# Patient Record
Sex: Male | Born: 1945 | Race: White | Hispanic: No | Marital: Married | Smoking: Current some day smoker
Health system: Southern US, Community
[De-identification: ages and names within clinical notes are randomized; demographics above are authoritative.]

## PROBLEM LIST (undated history)

## (undated) DIAGNOSIS — G35 Multiple sclerosis: Secondary | ICD-10-CM

## (undated) DIAGNOSIS — E119 Type 2 diabetes mellitus without complications: Secondary | ICD-10-CM

## (undated) DIAGNOSIS — I639 Cerebral infarction, unspecified: Secondary | ICD-10-CM

## (undated) DIAGNOSIS — E785 Hyperlipidemia, unspecified: Secondary | ICD-10-CM

## (undated) HISTORY — DX: Multiple sclerosis: G35

## (undated) HISTORY — DX: Type 2 diabetes mellitus without complications: E11.9

## (undated) HISTORY — DX: Cerebral infarction, unspecified: I63.9

## (undated) HISTORY — DX: Hyperlipidemia, unspecified: E78.5

## (undated) HISTORY — PX: HAND TENDON SURGERY: SHX663

## (undated) HISTORY — PX: OTHER SURGICAL HISTORY: SHX169

---

## 2005-04-23 DIAGNOSIS — I639 Cerebral infarction, unspecified: Secondary | ICD-10-CM

## 2005-04-23 HISTORY — DX: Cerebral infarction, unspecified: I63.9

## 2005-08-23 ENCOUNTER — Encounter: Payer: Self-pay | Admitting: Interventional Radiology

## 2005-10-05 ENCOUNTER — Ambulatory Visit (HOSPITAL_COMMUNITY): Admission: RE | Admit: 2005-10-05 | Discharge: 2005-10-06 | Payer: Self-pay | Admitting: Interventional Radiology

## 2005-10-05 ENCOUNTER — Ambulatory Visit: Admission: RE | Admit: 2005-10-05 | Discharge: 2005-10-05 | Payer: Self-pay | Admitting: Interventional Radiology

## 2005-10-26 ENCOUNTER — Encounter: Payer: Self-pay | Admitting: Interventional Radiology

## 2006-02-08 ENCOUNTER — Ambulatory Visit (HOSPITAL_COMMUNITY): Admission: RE | Admit: 2006-02-08 | Discharge: 2006-02-08 | Payer: Self-pay | Admitting: Interventional Radiology

## 2006-06-29 ENCOUNTER — Ambulatory Visit (HOSPITAL_COMMUNITY): Admission: RE | Admit: 2006-06-29 | Discharge: 2006-06-29 | Payer: Self-pay | Admitting: Interventional Radiology

## 2006-08-09 ENCOUNTER — Ambulatory Visit (HOSPITAL_COMMUNITY): Admission: RE | Admit: 2006-08-09 | Discharge: 2006-08-09 | Payer: Self-pay | Admitting: Interventional Radiology

## 2008-04-30 ENCOUNTER — Ambulatory Visit (HOSPITAL_COMMUNITY): Admission: RE | Admit: 2008-04-30 | Discharge: 2008-04-30 | Payer: Self-pay | Admitting: Interventional Radiology

## 2010-08-07 LAB — CBC
MCHC: 34.3 g/dL (ref 30.0–36.0)
MCV: 90.3 fL (ref 78.0–100.0)
Platelets: 220 10*3/uL (ref 150–400)
RDW: 13.4 % (ref 11.5–15.5)

## 2010-08-07 LAB — BASIC METABOLIC PANEL
BUN: 12 mg/dL (ref 6–23)
CO2: 27 mEq/L (ref 19–32)
Calcium: 9.1 mg/dL (ref 8.4–10.5)
Chloride: 109 mEq/L (ref 96–112)
Creatinine, Ser: 1.1 mg/dL (ref 0.4–1.5)

## 2010-08-07 LAB — PROTIME-INR
INR: 1 (ref 0.00–1.49)
Prothrombin Time: 13 seconds (ref 11.6–15.2)

## 2010-09-08 NOTE — Discharge Summary (Signed)
NAME:  Duane Harper, Duane Harper               ACCOUNT NO.:  [id number]   MEDICAL RECORD NO.:  [id number]          PATIENT TYPE:  OIB   LOCATION:  3110                         FACILITY:  [facility]   PHYSICIAN:  Sanjeev K. 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  He was admitted to [facility] at that time by Dr.  Rebecca Eaton.  An MRI/MRA was consistent with a significant left middle cerebral  artery stenosis.   The patient was referred to Dr. Corliss Skains who saw the patient in  consultation on Aug 23, 2005.  Arrangements were made to have the patient  return to [facility] on October 05, 2005 to have a cerebral angiogram  and possible PTA stenting of the lower left middle cerebral artery.   PAST MEDICAL HISTORY:  Significant for BPH, hypertension, hyperlipidemia, a  long history of tobacco use with probable COPD.  The patient has quit  smoking.  He had a left CVA in April 2007.  He had a 2-D echo in the  hospital on April 29 that showed an ejection fraction of 55%.   ALLERGIES:  NO KNOWN DRUG ALLERGIES.   MEDICATIONS AT THE TIME OF ADMISSION:  TriCor, Flomax, Avodart, Plavix,  aspirin, lisinopril and simvastatin.   SOCIAL HISTORY:  The patient is married.  He has one child.  The patient  lives in [place] with his wife.  He quit smoking in early May.  He  previously worked as a Special educational needs teacher.   FAMILY HISTORY:  The patient's mother is alive.  She has a history of a CVA  as well as coronary artery disease and hypertension.  His father died at age  4 from cardiovascular disease.  She has a sister and a brother who are  alive and well.   HOSPITAL COURSE:  As noted this patient was admitted to [facility]  on October 05, 2005 by Dr. Corliss Skains to undergo a cerebral angiogram  due to a  history of a left CVA and an abnormal MRI/MRA consistent with a significant  left middle cerebral artery stenosis.  The angiogram was performed on the  day of admission.  This angiogram did show a severe stenosis of the left  middle cerebral artery.  The patient underwent PTA stenting of the lesion  under general anesthesia performed by Dr. Corliss Skains.  The patient tolerated  this procedure well and was subsequently admitted to the neuro intensive  care unit where he remained on IV heparin overnight.  The following day the  heparin was discontinued and the right femoral groin sheath was removed.  Hemostasis was obtained.  The patient was monitored on bedrest for the next  six hours.  Afterwards she was ambulated and arrangements were made to  discharge the patient later that evening in stable and improved condition.   LABORATORY DATA:  A CBC on the day of  discharge revealed hemoglobin 11,  hematocrit 31.7, WBCs were 8500, platelets 195,000.  On admission hemoglobin  had been 13.5, hematocrit 39.5.  A chemistry profile on the day of discharge  revealed a BUN of 12, creatinine 1.3, potassium was 3.6, glucose was 103.  On admission BUN had been 12, creatinine 1.4, potassium 3.6, glucose 117.   DISCHARGE INSTRUCTIONS:  The patient was told to resume his previous  medications which did include aspirin and Plavix as well as the other  medications listed above.  He was to limit his activity for at least two  weeks which included no driving, and he was not to return to work for at  least two weeks.  He was not to lift more than 10 or 20 pounds during this  time frame as well.  The patient was given instructions regarding the wound  care for his right femoral groin wound, and he was given a follow-up  appointment to see Dr. Corliss Skains in approximately two weeks.  He was told to  follow up with his primary care physician as needed or as scheduled.   PROBLEM LIST AT TIME OF DISCHARGE:  1.   History of a left cerebrovascular accident in April 2007 with right-      sided weakness which has since resolved.  2.  Significant left middle cerebral artery stenosis, status post      percutaneous transluminal angioplasty and stenting using a Wingspan      stent.  3.  History of hypertension.  4.  History of hyperlipidemia.  5.  Previous tobacco history with probable chronic obstructive pulmonary      disease.  6.  Benign prostatic hypertrophy.  7.  A 2-D echocardiogram on August 19, 2005 revealing ejection fraction of      55%.  8.  History of facial surgery as well as surgery for contracture release of      his fingers.  9.  Mild anemia following his intervention.  10. Mildly elevated glucose levels.      Delton See, P.A.    ______________________________  Grandville Silos. Corliss Skains, M.D.    DR/MEDQ  D:  10/25/2005  T:  10/25/2005  Job:  [id number]   cc:   Tomasa Blase, Dr.  Lincoln Maxin Sopala  Fax: [phone]

## 2010-09-08 NOTE — H&P (Signed)
NAME:  Duane Harper, Duane Harper               ACCOUNT NO.:  [id number]   MEDICAL RECORD NO.:  [id number]          PATIENT TYPE:  AMB   LOCATION:  SDS                          FACILITY:  [facility]   PHYSICIAN:  Sanjeev K. Deveshwar, M.D.DATE OF BIRTH:  1945-08-14   DATE OF ADMISSION:  10/05/2005  DATE OF DISCHARGE:                                HISTORY & PHYSICAL   CHIEF COMPLAINT:  History of left middle cerebral artery stenosis.   HISTORY OF PRESENT ILLNESS:  This is a very pleasant 65 year old male who  had a left CVA in April of 2007. He initially presented with right hand  numbness as well as a headache and some expressive aphasia. The patient was  admitted to [facility] by Dr. Maisie Fus. A MRI/MRA was consistent  with a significant left middle cerebral artery stenosis. The patient was  subsequently referred to Dr. Corliss Skains who saw the patient on Aug 23, 2005.  Arrangements were made to have the patient to return today for cerebral  angiogram and possible PTA stenting of the left middle cerebral artery.   PAST MEDICAL HISTORY:  1.  Significant for BPH.  2.  Hypertension.  3.  Hyperlipidemia.  4.  A long history of tobacco use. He has since quit. He probably has some      COPD.  5.  As noted, he had a left CVA in April of 2007. He had an echocardiogram      while in the hospital on April 29 that showed an ejection fraction of      55%.   ALLERGIES:  No known drug allergies.   CURRENT MEDICATIONS:  TriCor, Flomax, Avodart, Plavix, aspirin, lisinopril  and simvastatin.   SOCIAL HISTORY:  The patient is married. He has one child who lives in  [place] with his wife. He quit smoking in early May. He was working as a  Special educational needs teacher.   FAMILY HISTORY:  His mother is alive. She has a history of a CVA, coronary  artery disease and hypertension. His father passed away at age 74 from  cardiovascular disease. He has a sister and a brother who are alive and  well.   REVIEW OF  SYSTEMS:  Completely negative except for frequent headaches ever  since his CVA. As noted, he does have BPH and some urinary problems due to  the BPH. He notes that he bruises easily since being on aspirin and Plavix.  He has noted any residual deficits from his CVA.   PAST SURGICAL HISTORY:  Significant for some facial surgery and a  contracture release on his fingers. He denies any problems with anesthesia.   LABORATORY DATA:  An INR is 1.0, PTT is 32. CBC reveals hemoglobin 13,  hematocrit 37.7, platelets 230,000, WBC 7.7 thousand. BUN is 13, creatinine  1.3, potassium 3.8, sodium 138, glucose 106.   PHYSICAL EXAMINATION:  GENERAL:  Reveals a very pleasant, 65 year old white  male in no acute distress.  VITAL SIGNS:  Blood pressure 175/80, pulse 62, respirations 16, temperature  98, oxygen saturation 99% on room air.  HEENT:  Unremarkable.  NECK:  Reveals no bruits. No jugular venous distention.  HEART:  Reveals regular rate and rhythm without murmur.  LUNGS:  Clear but decreased.  ABDOMEN:  Soft, nontender.  EXTREMITIES:  Reveal pulses to be intact without edema.  NEUROLOGICAL:  Mental status:  The patient is alert and oriented and follows  commands. Cranial nerves II-XII are grossly intact. Sensation is intact to  light touch. Motor strength is 5/5 throughout. Cerebellar testing is intact.   His airway is rated at a 3. His ASA scale is a 3.   IMPRESSION:  1.  Left middle cerebral artery cerebral vascular accident with initial      right sided weakness which has since resolved with initial presentation      in April 2007.  2.  History of left middle cerebral artery stenosis by MRI/MRA.  3.  History of hypertension.  4.  History of hyperlipidemia.  5.  History of tobacco use with probable chronic obstructive pulmonary      disease, recently quit.  6.  Benign prostatic hypertrophy.  7.  Ejection fraction 55% by echocardiogram performed August 19, 2005.  8.  History of facial  surgery as well as surgery for contracture release of      his fingers.   PLAN:  The patient will undergo a cerebral angiogram today with possible PTA  stenting of the left middle cerebral artery if felt to be indicated and  safe.      Delton See, P.A.    ______________________________  Grandville Silos. Corliss Skains, M.D.    DR/MEDQ  D:  10/05/2005  T:  10/05/2005  Job:  [id number]   cc:   Maisie Fus  Fax: [phone]   Tomasa Blase, M.D.  [place]

## 2010-09-08 NOTE — Consult Note (Signed)
NAME:  Duane Harper, Duane Harper NO.:  [id number]   MEDICAL RECORD NO.:  [id number]          PATIENT TYPE:  OUT   LOCATION:  XRAY                         FACILITY:  [facility]   PHYSICIAN:  Delton See, P.A.   DATE OF BIRTH:  01-Aug-1945   DATE OF CONSULTATION:  10/26/2005  DATE OF DISCHARGE:                                   CONSULTATION   BRIEF HISTORY:  This is a pleasant 65 year old male who suffered a left CVA  in April 2007.  He initially presented with right hand numbness as well as a  headache and some expressive aphasia.  He was admitted to [facility]  by Dr. Rebecca Eaton.  An MRI/MRA was consistent with a significant left middle  cerebral artery stenosis.  The patient was referred to Dr. Corliss Skains, who  saw the patient in consultation Aug 23, 2005.  The patient was brought back  to [facility] on October 05, 2005, for a cerebral angiogram and PTA  stenting of the left middle cerebral artery using a Wingspan stent.  The  patient tolerated the procedure well.  He returns today accompanied by his  wife to be seen in follow-up.   PAST MEDICAL HISTORY:  1.  Significant for the above-noted CVA.  2.  Hypertension.  3.  Hyperlipidemia.  4.  BPH.  5.  A long history of tobacco use.  6.  Questionable COPD.  He quit smoking at the time of his CVA.  7.  He had a 2-D echo performed on April 29 that showed ejection fraction of      55%.   ALLERGIES:  No known drug allergies.   MEDICATIONS:  Tricor, Flomax, Avodart, Plavix, aspirin, lisinopril and  simvastatin.   SOCIAL HISTORY:  The patient is married.  He has one child.  The patient  lives in [place] with his wife.  He quit smoking in early May.  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   FAMILY HISTORY:  His mother is alive.  She has a history of a CVA, coronary  artery disease and hypertension.  His father passed away at age 24 from  cardiovascular disease.  She has a sister and  a brother, who are alive and  well.   IMPRESSION AND PLAN:  As noted, the patient returns today to be seen by Dr.  Corliss Skains approximately 2 weeks following his left middle cerebral artery  stent placement.  The patient is accompanied by his wife.  They report he is  doing well.  He remains on aspirin and Plavix.  He was concerned about the  fact that he bleeds very easily and freely whenever he injures himself.  He  attributes this, of course, to the aspirin and Plavix.  He asked Dr.  Corliss Skains if he might be able to come off the Plavix or at least cut down on  the dose, possibly to every other day.  Dr. Corliss Skains felt that the unless  the patient was having serious bleeding complications, he should remain on  the current dose of aspirin  and Plavix.  He is taking aspirin 81 mg daily.   The patient notes that he has headaches almost daily.  They are not severe,  but this is new for him.  He has also had occasional word-finding  difficulties, which she has had since his stroke.  He reports no significant  weakness or numbness of the right side.   The patient has resumed driving and most of his normal activities.  Dr.  Corliss Skains advised him not to do anything too strenuous and not to lift more  than 25 pounds.  Other than that, he felt that he could do pretty much as he  felt.   Dr. Corliss Skains recommend a repeat angiogram in 3-4 months in order further  evaluate the previously-stented left middle cerebral artery and to further  evaluate his other cerebrovascular circulation.  A possible CT scan had been  discussed for today.  However, the patient is doing so well, Dr. Corliss Skains  did not feel that it was necessary.   Greater than 30 minutes was spent on this consult.      Delton See, P.A.     DR/MEDQ  D:  10/26/2005  T:  10/26/2005  Job:  [id number]   cc:   Maisie Fus  Fax: [phone]   Foye Deer, M.D.  [place], [place]

## 2014-06-01 ENCOUNTER — Ambulatory Visit (INDEPENDENT_AMBULATORY_CARE_PROVIDER_SITE_OTHER): Payer: PPO | Admitting: Diagnostic Neuroimaging

## 2014-06-01 ENCOUNTER — Encounter: Payer: Self-pay | Admitting: Diagnostic Neuroimaging

## 2014-06-01 VITALS — BP 165/79 | HR 55 | Ht 70.0 in | Wt 194.0 lb

## 2014-06-01 DIAGNOSIS — R930 Abnormal findings on diagnostic imaging of skull and head, not elsewhere classified: Secondary | ICD-10-CM

## 2014-06-01 DIAGNOSIS — R9089 Other abnormal findings on diagnostic imaging of central nervous system: Secondary | ICD-10-CM

## 2014-06-01 NOTE — Patient Instructions (Signed)
I will check labs, MRI and evoked potential testing.

## 2014-06-01 NOTE — Progress Notes (Signed)
[facility]  PATIENT: Duane Harper DOB: 04-18-1946  REFERRING CLINICIAN: Sherwood Gambler / Ronnald Ramp HISTORY FROM: patient  REASON FOR VISIT: new consult    HISTORICAL  CHIEF COMPLAINT:  Chief Complaint  Patient presents with  . New Evaluation    having trouble walking, legs are shaking    HISTORY OF PRESENT ILLNESS:   69 year old right-handed male here for evaluation of abnormal MRI and brain stem lesion.  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 Patient has hypertension, diabetes, cigarette smoking.  In February 2015 patient had onset of tingling and numbness in bilateral knees, with progressive weakness into bilateral thighs. Patient was evaluated with MRI lumbar spine which showed degenerative changes. He was then referred to neurosurgery noted some hyperreflexia. Patient was lowered with MRI cervical and thoracic spine. Patient was found to have incidental brainstem lesion on MRI cervical spine. MRI brain again demonstrated this lesion with severe chronic small vessel ischemic disease. Question of possible superimposed demyelinating disease was raised.  Since that time patient's numbness and weakness in legs has continued to progress.  Of note patient also has some language and word finding difficulties since 2007.    REVIEW OF SYSTEMS: Full 14 system review of systems performed and notable only for restless legs weakness dizziness.  ALLERGIES: No Known Allergies  HOME MEDICATIONS: No outpatient prescriptions prior to visit.   No facility-administered medications prior to visit.   Prior to Admission medications   Medication Sig Start Date End Date Taking? Authorizing Provider  amLODipine (NORVASC) 5 MG tablet Take 5 mg by mouth daily.   Yes Historical Provider, MD  clopidogrel (PLAVIX) 75 MG tablet Take 75 mg by mouth daily.   Yes Historical Provider, MD  glimepiride (AMARYL) 4 MG tablet Take  4 mg by mouth daily with breakfast.   Yes Historical Provider, MD  lisinopril (PRINIVIL,ZESTRIL) 40 MG tablet Take 40 mg by mouth daily.   Yes Historical Provider, MD  meloxicam (MOBIC) 15 MG tablet Take 15 mg by mouth daily.   Yes Historical Provider, MD  metoprolol succinate (TOPROL-XL) 50 MG 24 hr tablet Take 50 mg by mouth daily. Take with or immediately following a meal.   Yes Historical Provider, MD     PAST MEDICAL HISTORY: Past Medical History  Diagnosis Date  . Stroke 2007  . Diabetes     PAST SURGICAL HISTORY: Past Surgical History  Procedure Laterality Date  . Brain stent      FAMILY HISTORY: Family History  Problem Relation Age of Onset  . Stroke    . Heart attack      SOCIAL HISTORY:  History   Social History  . Marital Status: Married    Spouse Name: Vaughan Basta     Number of Children: 1  . Years of Education: college   Occupational History  . retired     Social History Main Topics  . Smoking status: Current Some Day Smoker  . Smokeless tobacco: Not on file     Comment: 1-2 cigarettes a day  . Alcohol Use: 4.2 - 8.4 oz/week    7-14 Not specified per week  . Drug Use: No  . Sexual Activity: Not on file   Other Topics Concern  . Not on file   Social History Narrative   Live with wife Vaughan Basta   Drinks 3 cups of coffee a day        PHYSICAL EXAM  Filed Vitals:  06/01/14 1130 06/01/14 1209  BP: 190/85 165/79  Pulse: 58 55  Height: 5' 10"  (1.778 m)   Weight: 194 lb (87.998 kg)     Body mass index is 27.84 kg/(m^2).  No exam data present  No flowsheet data found.  GENERAL EXAM: Patient is in no distress; well developed, nourished and groomed; neck is supple  CARDIOVASCULAR: Regular rate and rhythm, no murmurs, no carotid bruits  NEUROLOGIC: MENTAL STATUS: awake, alert, oriented to person, place and time, recent and remote memory intact, normal attention and concentration, SOME MILD DECR  FLUENCY AND COMPREHENSION, naming intact, fund  of knowledge appropriate CRANIAL NERVE: no papilledema on fundoscopic exam, pupils equal and reactive to light, visual fields full to confrontation, extraocular muscles intact, no nystagmus, facial sensation and strength symmetric, hearing intact, palate elevates symmetrically, uvula midline, shoulder shrug symmetric, tongue midline. MOTOR: normal bulk and tone, full strength in the BUE, BLE; EXCEPT RIGHT HF 4+ SENSORY: normal and symmetric to light touch, pinprick, temperature, vibration COORDINATION: finger-nose-finger, fine finger normal REFLEXES: deep tendon reflexes present and symmetric (BUE 2, KNEES 2, ANKLES 1; TOES MUTE) GAIT/STATION: narrow based gait; SLOW, SLIGHTLY ATAXIC, able to walk on toes and tandem; MILD DIFF WITH HEEL GAIT; romberg is negative    DIAGNOSTIC DATA (LABS, IMAGING, TESTING) - I reviewed patient records, labs, notes, testing and imaging myself where available.  Lab Results  Component Value Date   WBC 6.1 04/30/2008   HGB 14.1 04/30/2008   HCT 41.1 04/30/2008   MCV 90.3 04/30/2008   PLT 220 04/30/2008      Component Value Date/Time   NA 141 04/30/2008 0817   K 4.0 04/30/2008 0817   CL 109 04/30/2008 0817   CO2 27 04/30/2008 0817   GLUCOSE 122* 04/30/2008 0817   BUN 12 04/30/2008 0817   CREATININE 1.10 04/30/2008 0817   CALCIUM 9.1 04/30/2008 0817   GFRNONAA >60 04/30/2008 0817   GFRAA  04/30/2008 0817    >60        The eGFR has been calculated using the MDRD equation. This calculation has not been validated in all clinical situations. eGFR's persistently <60 mL/min signify possible Chronic Kidney Disease.   No results found for: CHOL, HDL, LDLCALC, LDLDIRECT, TRIG, CHOLHDL No results found for: HGBA1C No results found for: VITAMINB12 No results found for: TSH  I reviewed images myself and agree with interpretation. -VRP  04/21/14 MRI brain 1. Stable appearance of an ovoid T2 hyperintense lesion within the posterior aspect of the  cervicomedullary junction without enhancement. This most likely represents a demyelinating lesion. A post ischemic lesion is also considered. This does not have the appearance of a neoplasm. 2. Remote lacunar infarcts of the basal ganglia bilaterally. 3. Extensive white matter changes in diffuse atrophy. This likely represents post ischemic changes with evidence for a peripheral infarct in the left cerebellum. The differential diagnosis includes and advanced demyelinating process. 4. No acute intracranial abnormality.  02/24/14 MRI cervical spine - 10-11 mm lesion at the cervicomedullary junction in the region of the obex. The most likely diagnosis is demyelinating disease. However, the possibility of mass or infarction is not excluded. Evaluation with contrast is suggested, as described above.   02/24/14 MRI thoracic - No cord lesion in the thoracic spine. Minimal, non-compressive disc bulges.      ASSESSMENT AND PLAN  69 y.o. year old male here with severe cerebrovascular disease with uncontrolled hypertension and diabetes, prior left MCA stent and left brain TIA, now with new  brainstem/cervical medullary junction lesion. Numerous present ischemic, demyelinating, autoimmune, inflammatory etiology. Neoplasm less likely.   PLAN:  - further workup for multiple sclerosis vs stroke vs other autoimmune/inflamm  Orders Placed This Encounter  Procedures  . MR Brain W Wo Contrast  . Vitamin B12  . TSH  . Angiotensin converting enzyme  . HIV antibody (with reflex)  . ANA w/Reflex if Positive  . ANCA screen with reflex titer  . Copper, Serum  . Ceruloplasmin  . Visual evoked potential test   Return in about 1 month (around 06/30/2014) for after MRI brain.    Penni Bombard, MD 12/30/2427, 98:06 PM Certified in Neurology, Neurophysiology and Neuroimaging  [facility] [address] [place], [place] [address] [phone]

## 2014-06-02 ENCOUNTER — Ambulatory Visit (INDEPENDENT_AMBULATORY_CARE_PROVIDER_SITE_OTHER): Payer: PPO | Admitting: Diagnostic Neuroimaging

## 2014-06-02 DIAGNOSIS — R930 Abnormal findings on diagnostic imaging of skull and head, not elsewhere classified: Secondary | ICD-10-CM

## 2014-06-02 DIAGNOSIS — R9089 Other abnormal findings on diagnostic imaging of central nervous system: Secondary | ICD-10-CM

## 2014-06-02 LAB — PAN-ANCA
Atypical pANCA: <1:20 {titer}
C-ANCA: <1:20 {titer}
Myeloperoxidase Ab: <9 U/mL (ref 0.0–9.0)
P-ANCA: <1:20 {titer}

## 2014-06-02 LAB — SPECIMEN STATUS REPORT

## 2014-06-05 LAB — CERULOPLASMIN: Ceruloplasmin: 25.7 mg/dL (ref 16.0–31.0)

## 2014-06-05 LAB — VITAMIN B12: Vitamin B-12: 216 pg/mL (ref 211–946)

## 2014-06-05 LAB — HIV ANTIBODY (ROUTINE TESTING W REFLEX): HIV Screen 4th Generation wRfx: NONREACTIVE

## 2014-06-05 LAB — ANA W/REFLEX IF POSITIVE: Anti Nuclear Antibody(ANA): NEGATIVE

## 2014-06-05 LAB — COPPER, SERUM: COPPER: 123 ug/dL (ref 72–166)

## 2014-06-05 LAB — TSH: TSH: 0.906 u[IU]/mL (ref 0.450–4.500)

## 2014-06-05 LAB — ANGIOTENSIN CONVERTING ENZYME: Angio Convert Enzyme: <14 U/L — ABNORMAL LOW (ref 14–82)

## 2014-06-09 ENCOUNTER — Telehealth: Payer: Self-pay | Admitting: *Deleted

## 2014-06-09 NOTE — Telephone Encounter (Signed)
Entered in error

## 2014-06-16 NOTE — Procedures (Signed)
    [facility]  VEP (VISUAL EVOKED POTENTIAL) REPORT   STUDY DATE: 06/02/14 PATIENT NAME: Duane Harper DOB: [birth year] MRN: [id number]  ORDERING CLINICIAN: Joycelyn Schmid, MD   TECHNOLOGIST: Gearldine Shown  TECHNIQUE: The visual evoked potential test was performed using 32 x 32 check sizes with full pattern reversal. CLINICAL INFORMATION: 69 year old male with abnormal MRI brain. Evaluate for demyelinating disease.  FINDINGS: The visual acuity was 20/100 OD and 20/30 OS.  There are well formed evoked potential wave forms bilaterally.   P100 latency with right eye stimulation: 127 ms.   P100 latency with left eye stimulation: 112 ms.  The amplitudes for the P100 waveforms were also within normal limits bilaterally.   IMPRESSION:  This visual evoked potential study is abnormal. There is prolongation of P100 latency with right eye stimulation, which raises possibility of dysfunction of the optic pathways anterior to the chiasm on the right side. This can be seen with compressive or demyelinating etiologies.      INTERPRETING PHYSICIAN:  Suanne Marker, MD Certified in Neurology, Neurophysiology and Neuroimaging  [facility] [address] [place], [place] [address] [phone]

## 2014-06-21 ENCOUNTER — Ambulatory Visit
Admission: RE | Admit: 2014-06-21 | Discharge: 2014-06-21 | Disposition: A | Discharge status: Home / Self Care | Payer: PPO | Source: Ambulatory Visit | Attending: Diagnostic Neuroimaging | Admitting: Diagnostic Neuroimaging

## 2014-06-21 DIAGNOSIS — R269 Unspecified abnormalities of gait and mobility: Secondary | ICD-10-CM | POA: Diagnosis not present

## 2014-06-21 DIAGNOSIS — R9089 Other abnormal findings on diagnostic imaging of central nervous system: Secondary | ICD-10-CM

## 2014-06-21 DIAGNOSIS — R930 Abnormal findings on diagnostic imaging of skull and head, not elsewhere classified: Secondary | ICD-10-CM | POA: Diagnosis not present

## 2014-06-21 MED ORDER — GADOBENATE DIMEGLUMINE 529 MG/ML IV SOLN
18.0000 mL | Freq: Once | INTRAVENOUS | Status: AC | PRN
  Administered 2014-06-21: 18 mL via INTRAVENOUS

## 2014-06-29 ENCOUNTER — Telehealth: Payer: Self-pay | Admitting: Diagnostic Neuroimaging

## 2014-06-29 DIAGNOSIS — R9089 Other abnormal findings on diagnostic imaging of central nervous system: Secondary | ICD-10-CM

## 2014-06-29 DIAGNOSIS — R270 Ataxia, unspecified: Secondary | ICD-10-CM

## 2014-06-29 NOTE — Telephone Encounter (Signed)
Patient is calling to get results from his MRI test. Please call.  Thanks!

## 2014-07-02 NOTE — Telephone Encounter (Signed)
I called patient. Gave results. Progression of MRI brain and abnl visual evoked potential. Will setup LP for multiple sclerosis evaluation.  Suanne Marker, MD 07/02/2014, 2:43 PM Certified in Neurology, Neurophysiology and Neuroimaging  [facility] [address] [place], [place] [address] [phone]

## 2014-07-02 NOTE — Telephone Encounter (Signed)
Pt is calling back requesting his results of his MRI.  Please call and advise.

## 2014-07-14 ENCOUNTER — Other Ambulatory Visit (HOSPITAL_COMMUNITY)
Admission: RE | Admit: 2014-07-14 | Discharge: 2014-07-14 | Disposition: A | Discharge status: Home / Self Care | Payer: PPO | Source: Ambulatory Visit | Attending: Diagnostic Neuroimaging | Admitting: Diagnostic Neuroimaging

## 2014-07-14 ENCOUNTER — Ambulatory Visit
Admission: RE | Admit: 2014-07-14 | Discharge: 2014-07-14 | Disposition: A | Discharge status: Home / Self Care | Payer: PPO | Source: Ambulatory Visit | Attending: Diagnostic Neuroimaging | Admitting: Diagnostic Neuroimaging

## 2014-07-14 ENCOUNTER — Other Ambulatory Visit: Payer: Self-pay | Admitting: Diagnostic Neuroimaging

## 2014-07-14 DIAGNOSIS — G960 Cerebrospinal fluid leak: Secondary | ICD-10-CM | POA: Diagnosis present

## 2014-07-14 DIAGNOSIS — R270 Ataxia, unspecified: Secondary | ICD-10-CM

## 2014-07-14 DIAGNOSIS — R930 Abnormal findings on diagnostic imaging of skull and head, not elsewhere classified: Secondary | ICD-10-CM | POA: Insufficient documentation

## 2014-07-14 DIAGNOSIS — R9089 Other abnormal findings on diagnostic imaging of central nervous system: Secondary | ICD-10-CM

## 2014-07-14 LAB — CSF CELL COUNT WITH DIFFERENTIAL
RBC Count, CSF: 0 cu mm
Tube #: 3
WBC CSF: 0 uL (ref 0–5)

## 2014-07-14 LAB — GLUCOSE, CSF: Glucose, CSF: 72 mg/dL (ref 43–76)

## 2014-07-14 LAB — PROTEIN, CSF: TOTAL PROTEIN, CSF: 67 mg/dL — AB (ref 15–45)

## 2014-07-14 NOTE — Progress Notes (Signed)
Two tiger-topped tubes of blood drawn for LP labs from right [facility] space; site unremarkable.  jkl

## 2014-07-14 NOTE — Discharge Instructions (Signed)
Lumbar Puncture Discharge Instructions  1. Go home and rest quietly for the next 24 hours.  It is important to lie flat for the next 24 hours.  Get up only to go to the restroom.  You may lie in the bed or on a couch on your back, your stomach, your left side or your right side.  You may have one pillow under your head.  You may have pillows between your knees while you are on your side or under your knees while you are on your back.  2. DO NOT drive today.  Recline the seat as far back as it will go, while still wearing your seat belt, on the way home.  3. You may get up to go to the bathroom as needed.  You may sit up for 10 minutes to eat.  You may resume your normal diet and medications unless otherwise indicated.  Drink plenty of extra fluids today and tomorrow.  4. The incidence of a spinal headache with nausea and/or vomiting is about 5% (one in 20 patients).  If you develop a headache, lie flat and drink plenty of fluids until the headache goes away.  Caffeinated beverages may be helpful.  If you develop severe nausea and vomiting or a headache that does not go away with flat bed rest, please call the physician who sent you here.  5. You may resume normal activities after your 24 hours of bed rest is over; however, do not exert yourself strongly or do any heavy lifting tomorrow.  Please call us at [phone] with any questions or concerns.  6. Call your physician for a follow-up appointment.  You may resume Plavix today. 7.

## 2014-07-17 LAB — CSF CULTURE W GRAM STAIN: Organism ID, Bacteria: NO GROWTH

## 2014-07-17 LAB — OLIGOCLONAL BANDS, CSF + SERM

## 2014-07-17 LAB — CSF CULTURE: GRAM STAIN: NONE SEEN

## 2014-07-23 ENCOUNTER — Telehealth: Payer: Self-pay | Admitting: Diagnostic Neuroimaging

## 2014-07-23 NOTE — Telephone Encounter (Signed)
Pt is calling wanting to get results from his Lumbar Puncture.  Please call and advise.

## 2014-07-26 ENCOUNTER — Telehealth: Payer: Self-pay | Admitting: Diagnostic Neuroimaging

## 2014-07-26 NOTE — Telephone Encounter (Signed)
Called and left a message asking the pt to call back and schedule a follow-up appt in the next week or two. When he calls back please schedule him for a 20 or 30 min follow up in the next 1-2 weeks

## 2014-07-26 NOTE — Telephone Encounter (Signed)
I called pt; spoke with wife. Pt likely has multiple sclerosis. Will setup appt in next 1-2 weeks to discuss further and discuss treatment options. -VRP

## 2014-07-28 ENCOUNTER — Telehealth: Payer: Self-pay | Admitting: Diagnostic Neuroimaging

## 2014-07-28 NOTE — Telephone Encounter (Signed)
Called back and left a message for Duane Harper informing her of the ICD code of Multiple Sclerosis which is G35. Told her to call with any further questions

## 2014-07-28 NOTE — Telephone Encounter (Signed)
Danielle with [facility] @ [phone], requesting additional diagnosis code for CSF fluid in order for insurance to pay for labs.  Please call and advise.

## 2014-08-03 ENCOUNTER — Encounter: Payer: Self-pay | Admitting: Diagnostic Neuroimaging

## 2014-08-03 ENCOUNTER — Ambulatory Visit (INDEPENDENT_AMBULATORY_CARE_PROVIDER_SITE_OTHER): Payer: PPO | Admitting: Diagnostic Neuroimaging

## 2014-08-03 VITALS — BP 194/84 | HR 62 | Ht 70.0 in | Wt 196.6 lb

## 2014-08-03 DIAGNOSIS — I672 Cerebral atherosclerosis: Secondary | ICD-10-CM

## 2014-08-03 DIAGNOSIS — R0683 Snoring: Secondary | ICD-10-CM

## 2014-08-03 DIAGNOSIS — G35 Multiple sclerosis: Secondary | ICD-10-CM | POA: Diagnosis not present

## 2014-08-03 DIAGNOSIS — I10 Essential (primary) hypertension: Secondary | ICD-10-CM

## 2014-08-03 DIAGNOSIS — R5382 Chronic fatigue, unspecified: Secondary | ICD-10-CM | POA: Diagnosis not present

## 2014-08-03 DIAGNOSIS — G35D Multiple sclerosis, unspecified: Secondary | ICD-10-CM

## 2014-08-03 NOTE — Progress Notes (Signed)
[facility]  PATIENT: Duane Harper DOB: May 26, 1945  REFERRING CLINICIAN: Sherwood Gambler / Ronnald Ramp HISTORY FROM: patient and wife REASON FOR VISIT: follow up   HISTORICAL  CHIEF COMPLAINT:  Chief Complaint  Patient presents with  . Follow-up    HISTORY OF PRESENT ILLNESS:   UPDATE 08/03/14: Since last visit, testing completed (VEP, MRIs, labs, CSF). Likely patient has multiple sclerosis in addition to prior severe chronic cerebral atherosclerosis due to HTN, DM, hyperlipidemia, smoking. No new symptsoms  PRIOR HPI (06/01/14): 69 year old right-handed male here for evaluation of abnormal MRI and brain stem lesion. 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 Patient has hypertension, diabetes, cigarette smoking. In February 2015 patient had onset of tingling and numbness in bilateral knees, with progressive weakness into bilateral thighs. Patient was evaluated with MRI lumbar spine which showed degenerative changes. He was then referred to neurosurgery noted some hyperreflexia. Patient was lowered with MRI cervical and thoracic spine. Patient was found to have incidental brainstem lesion on MRI cervical spine. MRI brain again demonstrated this lesion with severe chronic small vessel ischemic disease. Question of possible superimposed demyelinating disease was raised. Since that time patient's numbness and weakness in legs has continued to progress. Of note patient also has some language and word finding difficulties since 2007.    REVIEW OF SYSTEMS: Full 14 system review of systems performed and notable only for restless legs weakness dizziness snoring.   ALLERGIES: No Known Allergies  HOME MEDICATIONS: Outpatient Prescriptions Prior to Visit  Medication Sig Dispense Refill  . amLODipine (NORVASC) 5 MG tablet Take 5 mg by mouth daily.    . clopidogrel (PLAVIX) 75 MG tablet Take 75 mg by mouth daily.    Marland Kitchen  glimepiride (AMARYL) 4 MG tablet Take 4 mg by mouth daily with breakfast.    . lisinopril (PRINIVIL,ZESTRIL) 40 MG tablet Take 40 mg by mouth daily.    . meloxicam (MOBIC) 15 MG tablet Take 15 mg by mouth daily.    . metoprolol succinate (TOPROL-XL) 50 MG 24 hr tablet Take 50 mg by mouth daily. Take with or immediately following a meal.     No facility-administered medications prior to visit.    PAST MEDICAL HISTORY: Past Medical History  Diagnosis Date  . Stroke 2007  . Diabetes     PAST SURGICAL HISTORY: Past Surgical History  Procedure Laterality Date  . Brain stent      FAMILY HISTORY: Family History  Problem Relation Age of Onset  . Stroke    . Heart attack      SOCIAL HISTORY:  History   Social History  . Marital Status: Married    Spouse Name: Vaughan Basta   . Number of Children: 1  . Years of Education: college   Occupational History  . retired     Social History Main Topics  . Smoking status: Current Some Day Smoker  . Smokeless tobacco: Not on file     Comment: 1-2 cigarettes a day  . Alcohol Use: 4.2 - 8.4 oz/week    7-14 Standard drinks or equivalent per week  . Drug Use: No  . Sexual Activity: Not on file   Other Topics Concern  . Not on file   Social History Narrative   Live with wife Vaughan Basta   Drinks 3 cups of coffee a day (depends on the day)        PHYSICAL EXAM  Filed Vitals:   08/03/14 1353  BP: 194/84  Pulse: 62  Height: 5' 10"  (1.778 m)  Weight: 196 lb 9.6 oz (89.177 kg)    Body mass index is 28.21 kg/(m^2).  No exam data present  No flowsheet data found.  GENERAL EXAM: Patient is in no distress; well developed, nourished and groomed; neck is supple  CARDIOVASCULAR: Regular rate and rhythm, no murmurs, no carotid bruits  NEUROLOGIC: MENTAL STATUS: awake, alert, oriented to person, place and time, recent and remote memory intact, normal attention and concentration, SOME MILD DECR  FLUENCY AND COMPREHENSION, naming intact,  fund of knowledge appropriate CRANIAL NERVE: pupils equal and reactive to light, visual fields full to confrontation, extraocular muscles intact, no nystagmus, facial sensation and strength symmetric, hearing intact, palate elevates symmetrically, uvula midline, shoulder shrug symmetric, tongue midline. MOTOR: normal bulk and tone, full strength in the BUE, BLE SENSORY: normal and symmetric to light touch, pinprick, temperature, vibration COORDINATION: finger-nose-finger, fine finger normal REFLEXES: BUE 2, KNEES 2, ANKLES 1 GAIT/STATION: narrow based gait; SLOW, SLIGHTLY ATAXIC.    DIAGNOSTIC DATA (LABS, IMAGING, TESTING) - I reviewed patient records, labs, notes, testing and imaging myself where available.  Lab Results  Component Value Date   WBC 6.1 04/30/2008   HGB 14.1 04/30/2008   HCT 41.1 04/30/2008   MCV 90.3 04/30/2008   PLT 220 04/30/2008      Component Value Date/Time   NA 141 04/30/2008 0817   K 4.0 04/30/2008 0817   CL 109 04/30/2008 0817   CO2 27 04/30/2008 0817   GLUCOSE 122* 04/30/2008 0817   BUN 12 04/30/2008 0817   CREATININE 1.10 04/30/2008 0817   CALCIUM 9.1 04/30/2008 0817   GFRNONAA >60 04/30/2008 0817   GFRAA  04/30/2008 0817    >60        The eGFR has been calculated using the MDRD equation. This calculation has not been validated in all clinical situations. eGFR's persistently <60 mL/min signify possible Chronic Kidney Disease.   No results found for: CHOL, HDL, LDLCALC, LDLDIRECT, TRIG, CHOLHDL No results found for: HGBA1C Lab Results  Component Value Date   VITAMINB12 216 06/01/2014   Lab Results  Component Value Date   TSH 0.906 06/01/2014    I reviewed images myself and agree with interpretation. -VRP    02/24/14 MRI cervical spine - 10-11 mm lesion at the cervicomedullary junction in the region of the obex. The most likely diagnosis is demyelinating disease. However, the possibility of mass or infarction is not excluded.  Evaluation with contrast is suggested, as described above.   02/24/14 MRI thoracic - No cord lesion in the thoracic spine. Minimal, non-compressive disc bulges.  06/02/14 visual evoked potential - There is prolongation of P100 latency with right eye stimulation, which raises possibility of dysfunction of the optic pathways anterior to the chiasm on the right side. This can be seen with compressive or demyelinating etiologies.   04/21/14 MRI brain 1. Stable appearance of an ovoid T2 hyperintense lesion within the posterior aspect of the cervicomedullary junction without enhancement. This most likely represents a demyelinating lesion. A post ischemic lesion is also considered. This does not have the appearance of a neoplasm. 2. Remote lacunar infarcts of the basal ganglia bilaterally. 3. Extensive white matter changes in diffuse atrophy. This likely represents post ischemic changes with evidence for a peripheral infarct in the left cerebellum. The differential diagnosis includes and advanced demyelinating process. 4. No acute intracranial abnormality.  06/21/14 MRI of the brain with and without contrast showing: 1. Widespread  white matter signal changes in both hemispheres and in the medulla that could represent demyelination or ischemic changes. The extent is unusual in genetic diseases such as adrenoleukodystrophy and CADASIL could be considered. These changes are unchanged when compared to 04/21/2014 but have deftly progressed when compared to MRI dated 06/29/2006. 2. Since 04/21/2014, there has been the interval development of a focus in the left pons that likely represents a lacunar infarction. It is not apparent on diffusion-weighted images and does not appear to be acute.   07/14/14 CSF - WBC 0, RBC 0, protein 67, glucose 72, oligoclonal bands > 5, cultures negative  06/01/14 labs - ANCA, ANA, TSH, b12, HIV, copper, ceruloplasmin - negative      ASSESSMENT AND PLAN  69 y.o. 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 Visual evoked potention testing, CSF analysis, and progression of brain lesions, consistent with new diagnosis of multiple sclerosis. The numerous brain lesions are due to combination of chronic ischemic and chronic demyelinating disease.  Dx: multiple sclerosis + severe cerebral atherosclerosis / vascular disease   PLAN: - start rebif - stop smoking - sleep study (HTN, stroke, fatigue, snoring) - needs aggressive vascular risk factor reduction via PCP; especially uncontrolled BP (today 194/84); he will call PCP today  Orders Placed This Encounter  Procedures  . CBC with Differential/Platelet  . Comprehensive metabolic panel  . Vit D  25 hydroxy (rtn osteoporosis monitoring)  . Ambulatory referral to Sleep Studies   Return in about 3 months (around 11/02/2014).  I spent 25 minutes of face to face time with patient. Greater than 50% of time was spent in counseling and coordination of care with patient.    Penni Bombard, MD 3/38/2505, 3:97 PM Certified in Neurology, Neurophysiology and Neuroimaging  [facility] [address], [place], [place] [address] [phone]

## 2014-08-03 NOTE — Patient Instructions (Signed)
I will start rebif.  Please stop smoking.  I will setup sleep study.

## 2014-08-04 ENCOUNTER — Telehealth: Payer: Self-pay | Admitting: Diagnostic Neuroimaging

## 2014-08-04 LAB — COMPREHENSIVE METABOLIC PANEL
A/G RATIO: 1.6 (ref 1.1–2.5)
ALT: 22 IU/L (ref 0–44)
AST: 16 IU/L (ref 0–40)
Albumin: 4.2 g/dL (ref 3.6–4.8)
Alkaline Phosphatase: 85 IU/L (ref 39–117)
BILIRUBIN TOTAL: 0.6 mg/dL (ref 0.0–1.2)
BUN/Creatinine Ratio: 9 — ABNORMAL LOW (ref 10–22)
BUN: 11 mg/dL (ref 8–27)
CHLORIDE: 97 mmol/L (ref 97–108)
CO2: 27 mmol/L (ref 18–29)
Calcium: 9.5 mg/dL (ref 8.6–10.2)
Creatinine, Ser: 1.21 mg/dL (ref 0.76–1.27)
GFR calc Af Amer: 71 mL/min/{1.73_m2} (ref >59)
GFR, EST NON AFRICAN AMERICAN: 62 mL/min/{1.73_m2} (ref >59)
GLOBULIN, TOTAL: 2.7 g/dL (ref 1.5–4.5)
GLUCOSE: 156 mg/dL — AB (ref 65–99)
POTASSIUM: 4.2 mmol/L (ref 3.5–5.2)
Sodium: 138 mmol/L (ref 134–144)
TOTAL PROTEIN: 6.9 g/dL (ref 6.0–8.5)

## 2014-08-04 LAB — CBC WITH DIFFERENTIAL/PLATELET
BASOS ABS: 0 10*3/uL (ref 0.0–0.2)
Basos: 0 %
Eos: 3 %
Eosinophils Absolute: 0.2 10*3/uL (ref 0.0–0.4)
HCT: 48.5 % (ref 37.5–51.0)
Hemoglobin: 16.7 g/dL (ref 12.6–17.7)
IMMATURE GRANS (ABS): 0 10*3/uL (ref 0.0–0.1)
Immature Granulocytes: 0 %
Lymphocytes Absolute: 2.7 10*3/uL (ref 0.7–3.1)
Lymphs: 31 %
MCH: 31.5 pg (ref 26.6–33.0)
MCHC: 34.4 g/dL (ref 31.5–35.7)
MCV: 92 fL (ref 79–97)
Monocytes Absolute: 1 10*3/uL — ABNORMAL HIGH (ref 0.1–0.9)
Monocytes: 12 %
NEUTROS PCT: 54 %
Neutrophils Absolute: 4.7 10*3/uL (ref 1.4–7.0)
Platelets: 202 10*3/uL (ref 150–379)
RBC: 5.3 x10E6/uL (ref 4.14–5.80)
RDW: 14.5 % (ref 12.3–15.4)
WBC: 8.6 10*3/uL (ref 3.4–10.8)

## 2014-08-04 LAB — VITAMIN D 25 HYDROXY (VIT D DEFICIENCY, FRACTURES): VIT D 25 HYDROXY: 17.3 ng/mL — AB (ref 30.0–100.0)

## 2014-08-04 NOTE — Telephone Encounter (Signed)
Duane Harper with [employer] @ [phone] stated information was missing on form for Rx Rebif.  No delivery method was indicated on form.  Please call and advise.

## 2014-08-05 NOTE — Telephone Encounter (Signed)
Duane Harper with [facility] [employer] [facility] for Rebif @ [phone], stated she received referral form for rebif.  However, it comes in two forms, rebidose or pre filled syringes and neither was indicated on form.  Please call and advise.

## 2014-08-05 NOTE — Telephone Encounter (Signed)
Called and spoke with Victorino Dike and informed her that I had resubmitted the paperwork for the pt yesterday and that Dr. Marjory Lies had selected the rebidose for the pt. She told me she would look out for the paperwork and thanked me

## 2014-08-06 ENCOUNTER — Encounter: Payer: Self-pay | Admitting: *Deleted

## 2014-08-11 ENCOUNTER — Other Ambulatory Visit: Payer: Self-pay | Admitting: *Deleted

## 2014-08-11 ENCOUNTER — Telehealth: Payer: Self-pay | Admitting: Diagnostic Neuroimaging

## 2014-08-11 ENCOUNTER — Telehealth: Payer: Self-pay

## 2014-08-11 DIAGNOSIS — G35 Multiple sclerosis: Secondary | ICD-10-CM

## 2014-08-11 MED ORDER — PREDNISONE 10 MG PO TABS: ORAL_TABLET | ORAL | Status: DC

## 2014-08-11 MED ORDER — PREDNISONE 10 MG PO TABS
ORAL_TABLET | ORAL | Status: DC
Start: 2014-08-11 — End: 2014-09-01

## 2014-08-11 NOTE — Telephone Encounter (Signed)
Spoke with the pts wife on the phone about the cost of the rebif. I informed her that I had sent a message to our pharm tech Shanda Bumps and asked for her help in locating pt assistance. Bonita Quin asked if she should call the company and ask for assistance as well and I told her that it would not hurt to try. I also informed her that if the company was unable to provide any assistance to her, that we would need to have him come back into the office and talk about a different medication option. She was agreeable. Also asked if there was anything that he could get for the pain in his legs, i told her that I would speak with Dr. Marjory Lies when he was available.

## 2014-08-11 NOTE — Telephone Encounter (Signed)
I spoke with Duane Harper.  Recommended they contact [employer] at [phone] to see if they are eligible for co-pay assist program.  She is agreeable to this, and will call us back if anything further is needed.

## 2014-08-11 NOTE — Telephone Encounter (Signed)
Pt's spouse is calling stating she has questions regarding Rebif.  Please call and advise.

## 2014-08-11 NOTE — Telephone Encounter (Signed)
Spoke with Dr. Marjory Lies who asked me to place an order for a prednisone taper pack. Placed the order for a 60, 50, 40, 30, 20, 10 taper. Called the pt and informed him that it was called in and should help with the pain in his legs. He stated a thanks and an said he would call me back if the pain did not improve after finishing the taper.

## 2014-08-20 NOTE — Telephone Encounter (Signed)
Patient's wife is calling regarding medication for MS. Patient  did receive information from [employer] but states with their income they will probably not get any help. What is the patient suppose to do? Please call and advise. Thank you.

## 2014-08-20 NOTE — Telephone Encounter (Signed)
I called back and spoke with Duane Harper.  She is afraid they will not qualify for assistance due to their current income.  Says their policy states they must pay 33% of the cost of medication, which is why co-pay is so high.  They are in the process of completing the enrollment form for assistance.  Advised if they do not qualify, they can inquire with [facility] [employer] regarding non-profit organizations that provide grants.  They will look into this and let us know if they do not qualify.

## 2014-08-31 ENCOUNTER — Telehealth: Payer: Self-pay | Admitting: Diagnostic Neuroimaging

## 2014-08-31 ENCOUNTER — Telehealth: Payer: Self-pay

## 2014-08-31 NOTE — Telephone Encounter (Signed)
Ms Hathorn indicates the patient is experiencing leg pain.  Says it is not every day, has "good and bad days".  States when he previously took Prednisone Taper, the higher dose seemed to work well, however, lower doses seemed to be less effective.  Questioning if either a high dose Prednisone could be prescribed, or if something different is recommended.  Please advise.  Thank you.

## 2014-08-31 NOTE — Telephone Encounter (Signed)
Spoke to the pts wife who stated that she had talked to me on 08/19/14 after she got back from the beach and she stated that I had told her I would talk to Dr. Marjory Lies about changing his medication to something more affordable. I told her that I had not talked to her then and the last time I talked to her was 08/11/14 and that she had talked to [place] our [employer], on the same day. She told me that she had not, but I told her about the message. She told me about the copay and the fact that he makes too much money and that she wanted to try an "older MS medication" that would not cost as much. I told her that I would call Shanda Bumps and talk with her and Dr. Marjory Lies but that MS medications are going to cost a fair amount of money without a pt assistance program. I asked her if she had signed up with one for the pt and she told me no that she had not. I told her that normally once the medication forms leave me and go to [place], she handles things, so she was fine with me calling Shanda Bumps to find out what step we were on. I told her I would call her back. She thanked me   I called Lucille Passy. We talked about the pt thinking that she had spoken to me and I asked her about the pt needing to get the MS assistance form completed. She also told me the pt or the wife needed to talk to the insurance company. She told me that she had called the wife 08/11/14 and that she had talked with her about the copay issues and the coverage being too high to afford due to a recent inheritence. She also told me that the MS assistance form from [employer] was already sent to the wife but the wife had told her "I do not really want to fill this out". So Shanda Bumps told me that she would call the pt and clarify with her the issues about completing the form and that other medications would still be the same price with Medicare and a "recent inheritance".  Talked with Dr. Marjory Lies about the whole issue, who stated the same thing;  the medications are going to be the same cost with medicare insurance and in order to not pay a lot out of pocket, she would need to fill out the assistance form. I told him I would call the wife back after Shanda Bumps spoke with her

## 2014-08-31 NOTE — Telephone Encounter (Signed)
Patients wife called and stated that she called a week ago and spoke with Lelon Mast RN about possibly changing the medication because of cost. She is concerned because no one ever called her back and her husband is not taking any medication for MS while they are awaiting the call. Please call and advise. Wife's cell phone in case she cannot be reached at home [phone]). She is very frustrated and states that they are at the point of changing Neurologist if they cannot get this issue taken care of.

## 2014-08-31 NOTE — Telephone Encounter (Signed)
I called and spoke with Mr Bott.  He said they decided not to fill out the paperwork for assistance they were sent from [place] because they feel they will not qualify due an inheritance he received last year that had to be claimed as income.  I asked if they had spoken to [place] about alternate funding.  Says they did, however, did not think any grants were avail at this time.  I encouraged him to complete the application.  Asked if he knew if his policy was written where they are responsible for 33% of any medication.  He was not sure and said his wife usually takes care of these things, and she is not home at the present time.   I called [employer] at [phone].  Spoke with Sharyn Lull who transferred me to [place].  We spoke at great length.  Anderson Malta said she has spoken with Ms Earlean Polka directly, and based upon the information provided, they would qualify for assistance, and they sent an application to them for completion in April.  I explained they did not think they would qualify due to an inheritance.  Says she was never advised of this info when speaking with Ms Blahnik.  Also said they show a titration kit was entered in the system to be sent to the patient on 04/27, however, they failed to reach the patient, therefore were not able to set up delivery.  Indicates she looked into the patient's policy, and the way it is written, they are responsible for 33% of the cost of any drug, regardless, so the same issue would arise even if an alternate drug was chosen.  She asked me how much the inheritance was and I explained I did not inquire about the amount, because that is not info we need to know.  She said even if they are over the threshold, they can submit a detailed list of their medical expenses to the program and this will taken into consideration when processing application.  States they do not need anything from [place], and will need info from patient to continue process.  Anderson Malta will call Mr  and Ms Mohr and discuss the inheritance dollar amount and medical expense list now.  As well, will reiterate they must complete application for assistance and return it to the program.  She will cal [place] back should anything further be needed.   I waited a couple of minutes (since Anderson Malta is reaching out to the patient at this time), then called back.  Ms Earlean Polka was still not at home.  Called cell.  Said she had just gotten off of the phone with Anderson Malta and said the situation is being discussed with a supervisor, and they would call the patient back later today.  She thanked me for calling back and will wait for Anderson Malta to contact her again.  She will call us back if anything further is needed.  She also inquired about getting a Rx for the patient to take for leg pain.  Indicates this does not occur every day, he has "good and bad days".  Said the Prednisone previously prescribed helped some, but only at high doses, did not gain benefit from lower doses.  Advised I would gladly relay this info to the provider.  She was agreeable to this.

## 2014-09-01 ENCOUNTER — Other Ambulatory Visit: Payer: Self-pay | Admitting: *Deleted

## 2014-09-01 DIAGNOSIS — G35 Multiple sclerosis: Secondary | ICD-10-CM

## 2014-09-01 MED ORDER — PREDNISONE 10 MG PO TABS
ORAL_TABLET | ORAL | Status: DC
Start: 2014-09-01 — End: 2014-09-16

## 2014-09-01 NOTE — Telephone Encounter (Signed)
Called and spoke with the pt after talking with Dr. Marjory Lies. Asked the pt if he would like to try PT for the leg weakness and he told me no. He said that his wife wanted him to get "a stronger dose of prednisone". I told him that Dr. Demetrius Charity was thinking the leg pain and weakness would get better once he started his rebif; I stressed the importance that his wife needs to fill out the assistance form and get that done so he can start his medication. I also told him that once he started it would probably feel better. He stated an understanding but stated that he would still like another round of prednisone since it helped him the last time.  I spoke with Dr. Demetrius Charity and he asked me to place an order for a taper and told me that the next time the wife or pt call in, an appt needs to be made.  IF THE PT OR WIFE CALLS BACK, PLEASE SCHEDULE AN APPT FOR FOLLOW UP

## 2014-09-01 NOTE — Telephone Encounter (Signed)
Bonita Quin, pt's spouse called wanting to follow up and see if there is a script that has been called in to his pharmacy for him to take regarding the conversations she had with Shanda Bumps and Lelon Mast regarding patient's condition. Please call and advise. Bonita Quin can be reached @ [phone]

## 2014-09-16 ENCOUNTER — Encounter: Payer: Self-pay | Admitting: Neurology

## 2014-09-16 ENCOUNTER — Ambulatory Visit (INDEPENDENT_AMBULATORY_CARE_PROVIDER_SITE_OTHER): Payer: PPO | Admitting: Neurology

## 2014-09-16 VITALS — BP 160/72 | HR 68 | Resp 16 | Ht 70.0 in | Wt 192.0 lb

## 2014-09-16 DIAGNOSIS — R930 Abnormal findings on diagnostic imaging of skull and head, not elsewhere classified: Secondary | ICD-10-CM

## 2014-09-16 DIAGNOSIS — G4733 Obstructive sleep apnea (adult) (pediatric): Secondary | ICD-10-CM

## 2014-09-16 DIAGNOSIS — G4761 Periodic limb movement disorder: Secondary | ICD-10-CM

## 2014-09-16 DIAGNOSIS — G35 Multiple sclerosis: Secondary | ICD-10-CM | POA: Diagnosis not present

## 2014-09-16 DIAGNOSIS — G2581 Restless legs syndrome: Secondary | ICD-10-CM | POA: Diagnosis not present

## 2014-09-16 DIAGNOSIS — R9089 Other abnormal findings on diagnostic imaging of central nervous system: Secondary | ICD-10-CM

## 2014-09-16 DIAGNOSIS — I672 Cerebral atherosclerosis: Secondary | ICD-10-CM

## 2014-09-16 NOTE — Patient Instructions (Addendum)
Based on your symptoms and your exam I believe you are at risk for obstructive sleep apnea or OSA, and I think we should proceed with a sleep study to determine whether you do or do not have OSA and how severe it is. If you have more than mild OSA, I want you to consider treatment with CPAP. Please remember, the risks and ramifications of moderate to severe obstructive sleep apnea or OSA are: Cardiovascular disease, including congestive heart failure, stroke, difficult to control hypertension, arrhythmias, and even type 2 diabetes has been linked to untreated OSA. Sleep apnea causes disruption of sleep and sleep deprivation in most cases, which, in turn, can cause recurrent headaches, problems with memory, mood, concentration, focus, and vigilance. Most people with untreated sleep apnea report excessive daytime sleepiness, which can affect their ability to drive. Please do not drive if you feel sleepy.  I will see you back after your sleep study to go over the test results and where to go from there. We will call you after your sleep study and to set up an appointment at the time.   Please stop smoking, reduce your caffeine intake, increase your water intake, do not use illicit drugs.   Our sleep lab administrative assistant, Leanord Hawking will call you to schedule your sleep study. If you don't hear back from her by next week please feel free to call her at [phone]. This is her direct line and please leave a message with your phone number to call back if you get the voicemail box.
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
## 2014-10-11 ENCOUNTER — Ambulatory Visit (INDEPENDENT_AMBULATORY_CARE_PROVIDER_SITE_OTHER): Payer: PPO | Admitting: Neurology

## 2014-10-11 DIAGNOSIS — G4720 Circadian rhythm sleep disorder, unspecified type: Secondary | ICD-10-CM

## 2014-10-11 DIAGNOSIS — G4761 Periodic limb movement disorder: Secondary | ICD-10-CM

## 2014-10-11 DIAGNOSIS — G479 Sleep disorder, unspecified: Secondary | ICD-10-CM

## 2014-10-11 DIAGNOSIS — G4733 Obstructive sleep apnea (adult) (pediatric): Secondary | ICD-10-CM

## 2014-10-11 NOTE — Sleep Study (Signed)
Please see the scanned sleep study interpretation located in the Procedure tab within the Chart Review section. 

## 2014-10-18 ENCOUNTER — Telehealth: Payer: Self-pay | Admitting: Neurology

## 2014-10-18 DIAGNOSIS — G4733 Obstructive sleep apnea (adult) (pediatric): Secondary | ICD-10-CM

## 2014-10-18 NOTE — Telephone Encounter (Signed)
I spoke to patient and gave him results and recommendation. He states that he "wants nothing to do with a CPAP machine". I was able to make an appt for him with Dr. Frances Furbish tomorrow to discuss study and treatment options.

## 2014-10-18 NOTE — Telephone Encounter (Signed)
Dr. Richrd Humbles patient, seen by me on 09/16/14. Sleep study on 10/11/14.  Please call and notify the patient that the recent sleep study did confirm the diagnosis of obstructive sleep apnea (in the severe range) and that I recommend treatment for this in the form of CPAP. This will require a repeat sleep study for proper titration and mask fitting. Please explain to patient and arrange for a CPAP titration study. I have placed an order in the chart. Thanks, Huston Foley, MD, PhD [facility])

## 2014-10-19 ENCOUNTER — Telehealth: Payer: Self-pay

## 2014-10-19 ENCOUNTER — Ambulatory Visit: Payer: Self-pay | Admitting: Neurology

## 2014-10-19 NOTE — Telephone Encounter (Signed)
Patient cancelled day of appt.  

## 2014-10-27 ENCOUNTER — Telehealth: Payer: Self-pay | Admitting: Neurology

## 2014-10-27 NOTE — Telephone Encounter (Signed)
Called patient to schedule the CPAP titration, patient states he is not interested in having a CPAP for sleeping.

## 2014-10-27 NOTE — Telephone Encounter (Signed)
Patient no showed last appt with Dr. Frances Furbish. He is due to see Dr. Marjory Lies soon.

## 2014-11-10 ENCOUNTER — Ambulatory Visit (INDEPENDENT_AMBULATORY_CARE_PROVIDER_SITE_OTHER): Payer: PPO | Admitting: Diagnostic Neuroimaging

## 2014-11-10 ENCOUNTER — Encounter: Payer: Self-pay | Admitting: Diagnostic Neuroimaging

## 2014-11-10 VITALS — BP 137/70 | HR 59 | Ht 70.0 in | Wt 192.8 lb

## 2014-11-10 DIAGNOSIS — I672 Cerebral atherosclerosis: Secondary | ICD-10-CM | POA: Diagnosis not present

## 2014-11-10 DIAGNOSIS — G35 Multiple sclerosis: Secondary | ICD-10-CM | POA: Diagnosis not present

## 2014-11-10 DIAGNOSIS — G4733 Obstructive sleep apnea (adult) (pediatric): Secondary | ICD-10-CM | POA: Diagnosis not present

## 2014-11-10 NOTE — Progress Notes (Signed)
[facility]  PATIENT: Duane Harper DOB: 03/16/1946  REFERRING CLINICIAN: Newell Coral / Yetta Barre HISTORY FROM: patient and wife REASON FOR VISIT: follow up   HISTORICAL  CHIEF COMPLAINT:  Chief Complaint  Patient presents with  . Multiple Sclerosis    rm 7, wife - Bonita Quin  . Follow-up    HISTORY OF PRESENT ILLNESS:   UPDATE 11/10/14: Since last visit, now on rebif. Had sleep study, showing severe OSA, but he declined CPAP therapy. Still with significant right leg pain (since summer 2015), currently on mobic, aleve. Also on aspirin and plavix for stroke prevention. No GI bleeding of stomach pain.  UPDATE 08/03/14: Since last visit, testing completed (VEP, MRIs, labs, CSF). Likely patient has multiple sclerosis in addition to prior severe chronic cerebral atherosclerosis due to HTN, DM, hyperlipidemia, smoking. No new symptsoms  PRIOR HPI (06/01/14): 69 year old right-handed male here for evaluation of abnormal MRI and brain stem lesion. 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 Patient has hypertension, diabetes, cigarette smoking. In February 2015 patient had onset of tingling and numbness in bilateral knees, with progressive weakness into bilateral thighs. Patient was evaluated with MRI lumbar spine which showed degenerative changes. He was then referred to neurosurgery noted some hyperreflexia. Patient was lowered with MRI cervical and thoracic spine. Patient was found to have incidental brainstem lesion on MRI cervical spine. MRI brain again demonstrated this lesion with severe chronic small vessel ischemic disease. Question of possible superimposed demyelinating disease was raised. Since that time patient's numbness and weakness in legs has continued to progress. Of note patient also has some language and word finding difficulties since 2007.    REVIEW OF SYSTEMS: Full 14 system review of systems performed and  notable only for easy bruising bleeding blurred vision walking diff snoring.    ALLERGIES: No Known Allergies  HOME MEDICATIONS: Outpatient Prescriptions Prior to Visit  Medication Sig Dispense Refill  . amLODipine (NORVASC) 5 MG tablet Take 5 mg by mouth daily.    Marland Kitchen aspirin 81 MG tablet Take 81 mg by mouth daily.    . cholecalciferol (VITAMIN D) 1000 UNITS tablet Take 1,000 Units by mouth daily.    . clopidogrel (PLAVIX) 75 MG tablet Take 75 mg by mouth daily.    . Fish Oil OIL by Does not apply route.    Marland Kitchen glimepiride (AMARYL) 4 MG tablet Take 4 mg by mouth daily with breakfast.    . hydrOXYzine (ATARAX/VISTARIL) 50 MG tablet Take 50 mg by mouth 3 (three) times daily as needed.    . Interferon Beta-1a 22 MCG/0.5ML SOSY Inject into the skin.    Marland Kitchen lisinopril (PRINIVIL,ZESTRIL) 40 MG tablet Take 40 mg by mouth daily.    . metoprolol succinate (TOPROL-XL) 50 MG 24 hr tablet Take 50 mg by mouth daily. Take with or immediately following a meal.    . valsartan-hydrochlorothiazide (DIOVAN-HCT) 320-25 MG per tablet Take 1 tablet by mouth daily.     No facility-administered medications prior to visit.    PAST MEDICAL HISTORY: Past Medical History  Diagnosis Date  . Stroke 2007  . Diabetes   . Hyperlipemia   . Multiple sclerosis     PAST SURGICAL HISTORY: Past Surgical History  Procedure Laterality Date  . Brain stent      FAMILY HISTORY: Family History  Problem Relation Age of Onset  . Stroke    . Heart attack    . Heart attack Father   .  Restless legs syndrome Father     SOCIAL HISTORY:  History   Social History  . Marital Status: Married    Spouse Name: Bonita Quin   . Number of Children: 1  . Years of Education: college   Occupational History  . retired     Social History Main Topics  . Smoking status: Current Some Day Smoker  . Smokeless tobacco: Not on file     Comment: 1-2 cigarettes a day  . Alcohol Use: 4.2 - 8.4 oz/week    7-14 Standard drinks or  equivalent per week  . Drug Use: No  . Sexual Activity: Not on file   Other Topics Concern  . Not on file   Social History Narrative   Live with wife Bonita Quin   Drinks 3 cups of coffee some days      PHYSICAL EXAM  Filed Vitals:   11/10/14 1008  BP: 137/70  Pulse: 59  Height: 5\' 10"  (1.778 m)  Weight: 192 lb 12.8 oz (87.454 kg)    Body mass index is 27.66 kg/(m^2).   Visual Acuity Screening   Right eye Left eye Both eyes  Without correction:  20/40   With correction:     Comments: 11/10/14 unable to see any letters with right eye   No flowsheet data found.  GENERAL EXAM: Patient is in no distress; well developed, nourished and groomed; neck is supple  CARDIOVASCULAR: Regular rate and rhythm, no murmurs, no carotid bruits  NEUROLOGIC: MENTAL STATUS: awake, alert, oriented to person, place and time, recent and remote memory intact, normal attention and concentration, SOME MILD DECR  FLUENCY AND COMPREHENSION, naming intact, fund of knowledge appropriate CRANIAL NERVE: pupils equal and reactive to light, visual fields full to confrontation, extraocular muscles intact, no nystagmus, facial sensation and strength symmetric, hearing intact, palate elevates symmetrically, uvula midline, shoulder shrug symmetric, tongue midline. MOTOR: normal bulk and tone, full strength in the BUE, BLE SENSORY: normal and symmetric to light touch, pinprick, temperature, vibration COORDINATION: finger-nose-finger, fine finger normal REFLEXES: BUE 2, KNEES 2, ANKLES 1 GAIT/STATION: narrow based gait; SLOW, SLIGHTLY ATAXIC.    DIAGNOSTIC DATA (LABS, IMAGING, TESTING) - I reviewed patient records, labs, notes, testing and imaging myself where available.  Lab Results  Component Value Date   WBC 8.6 08/03/2014   HGB 16.7 08/03/2014   HCT 48.5 08/03/2014   MCV 92 08/03/2014   PLT 202 08/03/2014      Component Value Date/Time   NA 138 08/03/2014 1524   NA 141 04/30/2008 0817   K 4.2  08/03/2014 1524   CL 97 08/03/2014 1524   CO2 27 08/03/2014 1524   GLUCOSE 156* 08/03/2014 1524   GLUCOSE 122* 04/30/2008 0817   BUN 11 08/03/2014 1524   BUN 12 04/30/2008 0817   CREATININE 1.21 08/03/2014 1524   CALCIUM 9.5 08/03/2014 1524   PROT 6.9 08/03/2014 1524   AST 16 08/03/2014 1524   ALT 22 08/03/2014 1524   ALKPHOS 85 08/03/2014 1524   BILITOT 0.6 08/03/2014 1524   GFRNONAA 62 08/03/2014 1524   GFRAA 71 08/03/2014 1524   No results found for: CHOL, HDL, LDLCALC, LDLDIRECT, TRIG, CHOLHDL No results found for: ZOXW9U Lab Results  Component Value Date   VITAMINB12 216 06/01/2014   Lab Results  Component Value Date   TSH 0.906 06/01/2014   VIT D, 25-HYDROXY  Date Value Ref Range Status  08/03/2014 17.3* 30.0 - 100.0 ng/mL Final    Comment:    Vitamin D  deficiency has been defined by the Institute of Medicine and an Endocrine Society practice guideline as a level of serum 25-OH vitamin D less than 20 ng/mL (1,2). The Endocrine Society went on to further define vitamin D insufficiency as a level between 21 and 29 ng/mL (2). 1. IOM (Institute of Medicine). 2010. Dietary reference    intakes for calcium and D. [place]: The    [employer]. 2. Holick MF, Binkley [place], Bischoff-Ferrari HA, et al.    Evaluation, treatment, and prevention of vitamin D    deficiency: an Endocrine Society clinical practice    guideline. JCEM. 2011 Jul; 96(7):1911-30.      02/24/14 MRI cervical spine - 10-11 mm lesion at the cervicomedullary junction in the region of the obex. The most likely diagnosis is demyelinating disease. However, the possibility of mass or infarction is not excluded. Evaluation with contrast is suggested, as described above.   02/24/14 MRI thoracic - No cord lesion in the thoracic spine. Minimal, non-compressive disc bulges.  06/02/14 visual evoked potential - There is prolongation of P100 latency with right eye stimulation, which raises possibility  of dysfunction of the optic pathways anterior to the chiasm on the right side. This can be seen with compressive or demyelinating etiologies.   04/21/14 MRI brain 1. Stable appearance of an ovoid T2 hyperintense lesion within the posterior aspect of the cervicomedullary junction without enhancement. This most likely represents a demyelinating lesion. A post ischemic lesion is also considered. This does not have the appearance of a neoplasm. 2. Remote lacunar infarcts of the basal ganglia bilaterally. 3. Extensive white matter changes in diffuse atrophy. This likely represents post ischemic changes with evidence for a peripheral infarct in the left cerebellum. The differential diagnosis includes and advanced demyelinating process. 4. No acute intracranial abnormality.  06/21/14 MRI of the brain with and without contrast showing: 1. Widespread white matter signal changes in both hemispheres and in the medulla that could represent demyelination or ischemic changes. The extent is unusual in genetic diseases such as adrenoleukodystrophy and CADASIL could be considered. These changes are unchanged when compared to 04/21/2014 but have deftly progressed when compared to MRI dated 06/29/2006. 2. Since 04/21/2014, there has been the interval development of a focus in the left pons that likely represents a lacunar infarction. It is not apparent on diffusion-weighted images and does not appear to be acute.   07/14/14 CSF - WBC 0, RBC 0, protein 67, glucose 72, oligoclonal bands > 5, cultures negative  06/01/14 labs - ANCA, ANA, TSH, b12, HIV, copper, ceruloplasmin - negative      ASSESSMENT AND PLAN  70 y.o. year old male here with severe cerebrovascular disease with uncontrolled hypertension and diabetes, prior left MCA stent and left brain TIA, now with new brainstem/cervical medullary junction lesion. Visual evoked potention testing, CSF analysis, and progression of brain lesions, consistent with new  diagnosis of multiple sclerosis. The numerous brain lesions are due to combination of chronic ischemic and chronic demyelinating disease.  Dx: multiple sclerosis + severe cerebral atherosclerosis / vascular disease   PLAN: I spent 25 minutes of face to face time with patient. Greater than 50% of time was spent in counseling and coordination of care with patient. In summary we discussed:  - continue rebif - stop smoking - recommend CPAP therapy; patient declines - caution with gait, balance, falls, climbing, driving - continue aggressive vascular risk factor reduction via PCP  Return in about 6 months (around 05/13/2015).  Suanne Marker, MD 11/10/2014, 10:39 AM Certified in Neurology, Neurophysiology and Neuroimaging  [facility] [address], [address] [place], [place] [address] [phone]

## 2015-05-11 ENCOUNTER — Telehealth: Payer: Self-pay | Admitting: *Deleted

## 2015-05-11 NOTE — Telephone Encounter (Signed)
Spoke with Clydie Braun re: this office received letter stating pt would only be given med x 1 month, per [facility]. Clydie Braun stated patient is receiving assistance through their patient assistance program and has been approved to receive rebif this entire year. Gave her update phone numbers for patient and correct address. She stated she would contact patient to explain. No further action needed from this office.

## 2015-05-11 NOTE — Telephone Encounter (Signed)
[name] [phone] called to advise, was able to reach patient and he is aware that he will be able to get assistance, he does have medicine, he has not been out of medicine, if questions, please call back.

## 2015-05-16 ENCOUNTER — Encounter: Payer: Self-pay | Admitting: Diagnostic Neuroimaging

## 2015-05-16 ENCOUNTER — Ambulatory Visit (INDEPENDENT_AMBULATORY_CARE_PROVIDER_SITE_OTHER): Payer: PPO | Admitting: Diagnostic Neuroimaging

## 2015-05-16 VITALS — BP 155/72 | HR 61 | Ht 70.0 in | Wt 179.8 lb

## 2015-05-16 DIAGNOSIS — G35 Multiple sclerosis: Secondary | ICD-10-CM | POA: Diagnosis not present

## 2015-05-16 NOTE — Progress Notes (Signed)
[facility]  PATIENT: Duane Harper DOB: 04/13/1946  REFERRING CLINICIAN:  HISTORY FROM: patient REASON FOR VISIT: follow up   HISTORICAL  CHIEF COMPLAINT:  Chief Complaint  Patient presents with  . Multiple sclerosis    rm 7 , MRI brain 05/2014, Rebif, "doing well"  . Follow-up    6 month    HISTORY OF PRESENT ILLNESS:   UPDATE 05/16/15: Since last visit, doing well. No new symptoms. Tolerating rebif injections. Main issues are balance, back pain, leg pain. No bowel/bladder issues. No mood issues.   UPDATE 11/10/14: Since last visit, now on rebif. Had sleep study, showing severe OSA, but he declined CPAP therapy. Still with significant right leg pain (since summer 2015), currently on mobic, aleve. Also on aspirin and plavix for stroke prevention. No GI bleeding of stomach pain.  UPDATE 08/03/14: Since last visit, testing completed (VEP, MRIs, labs, CSF). Likely patient has multiple sclerosis in addition to prior severe chronic cerebral atherosclerosis due to HTN, DM, hyperlipidemia, smoking. No new symptsoms  PRIOR HPI (06/01/14): 70 year old right-handed male here for evaluation of abnormal MRI and brain stem lesion. 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 Patient has hypertension, diabetes, cigarette smoking. In February 2015 patient had onset of tingling and numbness in bilateral knees, with progressive weakness into bilateral thighs. Patient was evaluated with MRI lumbar spine which showed degenerative changes. He was then referred to neurosurgery noted some hyperreflexia. Patient was lowered with MRI cervical and thoracic spine. Patient was found to have incidental brainstem lesion on MRI cervical spine. MRI brain again demonstrated this lesion with severe chronic small vessel ischemic disease. Question of possible superimposed demyelinating disease was raised. Since that time patient's numbness and  weakness in legs has continued to progress. Of note patient also has some language and word finding difficulties since 2007.    REVIEW OF SYSTEMS: Full 14 system review of systems performed and notable only for as per HPI.    ALLERGIES: No Known Allergies  HOME MEDICATIONS: Outpatient Prescriptions Prior to Visit  Medication Sig Dispense Refill  . amLODipine (NORVASC) 5 MG tablet Take 5 mg by mouth daily.    Marland Kitchen aspirin 81 MG tablet Take 81 mg by mouth daily.    . cholecalciferol (VITAMIN D) 1000 UNITS tablet Take 1,000 Units by mouth daily.    . clopidogrel (PLAVIX) 75 MG tablet Take 75 mg by mouth daily.    . Fish Oil OIL by Does not apply route.    . gabapentin (NEURONTIN) 100 MG capsule Take 100 mg by mouth 3 (three) times daily. Patient unsure of dose    . glimepiride (AMARYL) 4 MG tablet Take 4 mg by mouth daily with breakfast.    . hydrOXYzine (ATARAX/VISTARIL) 50 MG tablet Take 50 mg by mouth 3 (three) times daily as needed.    . Interferon Beta-1a 22 MCG/0.5ML SOSY Inject into the skin.    Marland Kitchen lisinopril (PRINIVIL,ZESTRIL) 40 MG tablet Take 40 mg by mouth daily.    . meloxicam (MOBIC) 15 MG tablet Take 15 mg by mouth daily.    . metFORMIN (GLUMETZA) 500 MG (MOD) 24 hr tablet Take 500 mg by mouth daily with breakfast.    . metoprolol succinate (TOPROL-XL) 50 MG 24 hr tablet Take 50 mg by mouth daily. Take with or immediately following a meal.    . valsartan-hydrochlorothiazide (DIOVAN-HCT) 320-25 MG per tablet Take 1 tablet by mouth daily.  No facility-administered medications prior to visit.    PAST MEDICAL HISTORY: Past Medical History  Diagnosis Date  . Stroke [facility]) 2007  . Diabetes (HCC)   . Hyperlipemia   . Multiple sclerosis (HCC)     PAST SURGICAL HISTORY: Past Surgical History  Procedure Laterality Date  . Brain stent      FAMILY HISTORY: Family History  Problem Relation Age of Onset  . Stroke    . Heart attack    . Heart attack Father   . Restless  legs syndrome Father     SOCIAL HISTORY:  Social History   Social History  . Marital Status: Married    Spouse Name: Bonita Quin   . Number of Children: 1  . Years of Education: college   Occupational History  . retired     Social History Main Topics  . Smoking status: Current Some Day Smoker  . Smokeless tobacco: Not on file     Comment: 1-2 cigarettes a day  . Alcohol Use: 4.2 - 8.4 oz/week    7-14 Standard drinks or equivalent per week  . Drug Use: No  . Sexual Activity: Not on file   Other Topics Concern  . Not on file   Social History Narrative   Live with wife Bonita Quin   Drinks 3 cups of coffee some days      PHYSICAL EXAM  Filed Vitals:   05/16/15 1251  BP: 155/72  Pulse: 61  Height: 5\' 10"  (1.778 m)  Weight: 179 lb 12.8 oz (81.557 kg)    Body mass index is 25.8 kg/(m^2).   Visual Acuity Screening   Right eye Left eye Both eyes  Without correction: 20/100 20/200   With correction:     Comments: Has cataract surgery next month, Feb 2017   No flowsheet data found.  GENERAL EXAM: Patient is in no distress; well developed, nourished and groomed; neck is supple  CARDIOVASCULAR: Regular rate and rhythm, no murmurs, no carotid bruits  NEUROLOGIC: MENTAL STATUS: awake, alert, SOME MILD DECR  FLUENCY AND COMPREHENSION, naming intact, fund of knowledge appropriate CRANIAL NERVE: pupils equal and reactive to light, visual fields full to confrontation, extraocular muscles intact, no nystagmus, facial sensation and strength symmetric, hearing intact, palate elevates symmetrically, uvula midline, shoulder shrug symmetric, tongue midline. MOTOR: normal bulk and tone, full strength in the BUE, BLE SENSORY: normal and symmetric to light touch, temperature, vibration COORDINATION: finger-nose-finger, fine finger normal REFLEXES: BUE 2, KNEES 2, ANKLES 1 GAIT/STATION: narrow based gait; SLOW, SLIGHTLY ATAXIC WITH TANDEM.    DIAGNOSTIC DATA (LABS, IMAGING,  TESTING) - I reviewed patient records, labs, notes, testing and imaging myself where available.  Lab Results  Component Value Date   WBC 8.6 08/03/2014   HGB 16.7 08/03/2014   HCT 48.5 08/03/2014   MCV 92 08/03/2014   PLT 202 08/03/2014      Component Value Date/Time   NA 138 08/03/2014 1524   NA 141 04/30/2008 0817   K 4.2 08/03/2014 1524   CL 97 08/03/2014 1524   CO2 27 08/03/2014 1524   GLUCOSE 156* 08/03/2014 1524   GLUCOSE 122* 04/30/2008 0817   BUN 11 08/03/2014 1524   BUN 12 04/30/2008 0817   CREATININE 1.21 08/03/2014 1524   CALCIUM 9.5 08/03/2014 1524   PROT 6.9 08/03/2014 1524   ALBUMIN 4.2 08/03/2014 1524   AST 16 08/03/2014 1524   ALT 22 08/03/2014 1524   ALKPHOS 85 08/03/2014 1524   BILITOT 0.6 08/03/2014 1524  GFRNONAA 62 08/03/2014 1524   GFRAA 71 08/03/2014 1524   No results found for: CHOL, HDL, LDLCALC, LDLDIRECT, TRIG, CHOLHDL No results found for: [id number] Lab Results  Component Value Date   VITAMINB12 216 06/01/2014   Lab Results  Component Value Date   TSH 0.906 06/01/2014   VIT D, 25-HYDROXY  Date Value Ref Range Status  08/03/2014 17.3* 30.0 - 100.0 ng/mL Final    Comment:    Vitamin D deficiency has been defined by the Institute of Medicine and an Endocrine Society practice guideline as a level of serum 25-OH vitamin D less than 20 ng/mL (1,2). The Endocrine Society went on to further define vitamin D insufficiency as a level between 21 and 29 ng/mL (2). 1. IOM (Institute of Medicine). 2010. Dietary reference    intakes for calcium and D. [place]: The    [employer]. 2. Holick MF, Binkley [place], Bischoff-Ferrari HA, et al.    Evaluation, treatment, and prevention of vitamin D    deficiency: an Endocrine Society clinical practice    guideline. JCEM. 2011 Jul; 96(7):1911-30.     02/24/14 MRI cervical spine - 10-11 mm lesion at the cervicomedullary junction in the region of the obex. The most likely diagnosis is  demyelinating disease. However, the possibility of mass or infarction is not excluded. Evaluation with contrast is suggested, as described above.   02/24/14 MRI thoracic - No cord lesion in the thoracic spine. Minimal, non-compressive disc bulges.  06/02/14 visual evoked potential - There is prolongation of P100 latency with right eye stimulation, which raises possibility of dysfunction of the optic pathways anterior to the chiasm on the right side. This can be seen with compressive or demyelinating etiologies.   04/21/14 MRI brain 1. Stable appearance of an ovoid T2 hyperintense lesion within the posterior aspect of the cervicomedullary junction without enhancement. This most likely represents a demyelinating lesion. A post ischemic lesion is also considered. This does not have the appearance of a neoplasm. 2. Remote lacunar infarcts of the basal ganglia bilaterally. 3. Extensive white matter changes in diffuse atrophy. This likely represents post ischemic changes with evidence for a peripheral infarct in the left cerebellum. The differential diagnosis includes and advanced demyelinating process. 4. No acute intracranial abnormality.  06/21/14 MRI of the brain with and without contrast showing: 1. Widespread white matter signal changes in both hemispheres and in the medulla that could represent demyelination or ischemic changes. The extent is unusual in genetic diseases such as adrenoleukodystrophy and CADASIL could be considered. These changes are unchanged when compared to 04/21/2014 but have deftly progressed when compared to MRI dated 06/29/2006. 2. Since 04/21/2014, there has been the interval development of a focus in the left pons that likely represents a lacunar infarction. It is not apparent on diffusion-weighted images and does not appear to be acute.   07/14/14 CSF - WBC 0, RBC 0, protein 67, glucose 72, oligoclonal bands > 5, cultures negative  06/01/14 labs - ANCA, ANA, TSH, b12, HIV,  copper, ceruloplasmin - negative    ASSESSMENT AND PLAN  70 y.o. year old male here with severe cerebrovascular disease with uncontrolled hypertension and diabetes, prior left MCA stent and left brain TIA, now with new brainstem/cervical medullary junction lesion. Visual evoked potention testing, CSF analysis, and progression of brain lesions, consistent with diagnosis of multiple sclerosis. The numerous brain lesions are due to combination of chronic ischemic and chronic demyelinating disease.  Dx: multiple sclerosis + severe cerebral atherosclerosis / vascular  disease   PLAN: - continue rebif - stop smoking - recommend CPAP therapy; patient declines - caution with gait, balance, falls, climbing, driving - continue aggressive vascular risk factor reduction via PCP  Return in about 6 months (around 11/13/2015).    Suanne Marker, MD 05/16/2015, 1:42 PM Certified in Neurology, Neurophysiology and Neuroimaging  [facility] [address], [address] [place], [place] [address] [phone]

## 2015-05-16 NOTE — Patient Instructions (Signed)
Thank you for coming to see [place] at [facility]. I hope we have been able to provide you high quality care today.  You may receive a patient satisfaction survey over the next few weeks. We would appreciate your feedback and comments so that we may continue to improve ourselves and the health of our patients.  - continue rebif   ~~~~~~~~~~~~~~~~~~~~~~~~~~~~~~~~~~~~~~~~~~~~~~~~~~~~~~~~~~~~~~~~~  DR. Duron Meister'S GUIDE TO HAPPY AND HEALTHY LIVING These are some of my general health and wellness recommendations. Some of them may apply to you better than others. Please use common sense as you try these suggestions and feel free to ask me any questions.   ACTIVITY/FITNESS Mental, social, emotional and physical stimulation are very important for brain and body health. Try learning a new activity (arts, music, language, sports, games).  Keep moving your body to the best of your abilities. You can do this at home, inside or outside, the park, community center, gym or anywhere you like. Consider a physical therapist or personal trainer to get started. Consider the app Sworkit. Fitness trackers such as smart-watches, smart-phones or Fitbits can help as well.   NUTRITION Eat more plants: colorful vegetables, nuts, seeds and berries.  Eat less sugar, salt, preservatives and processed foods.  Avoid toxins such as cigarettes and alcohol.  Drink water when you are thirsty. Warm water with a slice of lemon is an excellent morning drink to start the day.  Consider these websites for more information The Nutrition Source (https://www.henry-hernandez.biz/) Precision Nutrition (WindowBlog.ch)   RELAXATION Consider practicing mindfulness meditation or other relaxation techniques such as deep breathing, prayer, yoga, tai chi, massage. See website mindful.org or the apps Headspace or Calm to help get started.   SLEEP Try to get at least 7-8+  hours sleep per day. Regular exercise and reduced caffeine will help you sleep better. Practice good sleep hygeine techniques. See website sleep.org for more information.   PLANNING Prepare estate planning, living will, healthcare POA documents. Sometimes this is best planned with the help of an attorney. Theconversationproject.org and agingwithdignity.org are excellent resources.

## 2015-06-06 ENCOUNTER — Encounter: Payer: Self-pay | Admitting: *Deleted

## 2015-07-29 ENCOUNTER — Other Ambulatory Visit: Payer: Self-pay | Admitting: *Deleted

## 2015-07-29 MED ORDER — INTERFERON BETA-1A 22 MCG/0.5ML ~~LOC~~ SOSY: 44.0000 ug | PREFILLED_SYRINGE | SUBCUTANEOUS | Status: AC

## 2015-08-02 ENCOUNTER — Telehealth: Payer: Self-pay | Admitting: Diagnostic Neuroimaging

## 2015-08-02 NOTE — Telephone Encounter (Signed)
Margaret[facility] [phone] option 1 to speak with a pharmacist, called for clarification on 22 mcg of Interferon Beta-1a 22 MCG/0.5ML SOSY, states patient has always been on 44 mcg.

## 2015-08-02 NOTE — Telephone Encounter (Signed)
Spoke with Rene Kocher, pt account manager who transferred call to Molly Maduro, pharmacist to clarify order. He verbalized understanding of rebif dose 44 mcg, inject three times weekly.

## 2015-10-22 HISTORY — PX: CATARACT EXTRACTION, BILATERAL: SHX1313

## 2015-10-24 ENCOUNTER — Encounter: Payer: Self-pay | Admitting: *Deleted

## 2015-10-24 NOTE — Progress Notes (Signed)
Faxed signed orders back to [facility] for rebif rebidose 36 syringes (3 boxes=58mL) w/ 3 refills. Fax: [phone]. Received confirmation.

## 2015-11-14 ENCOUNTER — Ambulatory Visit: Payer: PPO | Admitting: Diagnostic Neuroimaging

## 2015-11-30 ENCOUNTER — Ambulatory Visit (INDEPENDENT_AMBULATORY_CARE_PROVIDER_SITE_OTHER): Payer: PPO | Admitting: Diagnostic Neuroimaging

## 2015-11-30 ENCOUNTER — Encounter: Payer: Self-pay | Admitting: Diagnostic Neuroimaging

## 2015-11-30 VITALS — BP 116/58 | HR 62

## 2015-11-30 DIAGNOSIS — G4733 Obstructive sleep apnea (adult) (pediatric): Secondary | ICD-10-CM | POA: Diagnosis not present

## 2015-11-30 DIAGNOSIS — R270 Ataxia, unspecified: Secondary | ICD-10-CM

## 2015-11-30 DIAGNOSIS — R5382 Chronic fatigue, unspecified: Secondary | ICD-10-CM | POA: Diagnosis not present

## 2015-11-30 DIAGNOSIS — I672 Cerebral atherosclerosis: Secondary | ICD-10-CM | POA: Diagnosis not present

## 2015-11-30 DIAGNOSIS — G35 Multiple sclerosis: Secondary | ICD-10-CM

## 2015-11-30 NOTE — Progress Notes (Signed)
[facility]  PATIENT: Duane Harper DOB: 10-31-1945  REFERRING CLINICIAN:  HISTORY FROM: patient REASON FOR VISIT: follow up   HISTORICAL  CHIEF COMPLAINT:  Chief Complaint  Patient presents with  . Multiple Sclerosis    rm 7, wife - Bonita Quin , Rebif, "walking difficulty continues, some days worse than others; very anxious at times, irritable"  . Follow-up    6 month, 05/2014 MRI brain    HISTORY OF PRESENT ILLNESS:   UPDATE 11/30/15: Since last visit, overall stable. No new issues. Has noted 1 year of decreased taste / smell. Tolerating rebif injections. Balance is still affected at times. Mood and agitation fluctuate.  UPDATE 05/16/15: Since last visit, doing well. No new symptoms. Tolerating rebif injections. Main issues are balance, back pain, leg pain. No bowel/bladder issues. No mood issues.   UPDATE 11/10/14: Since last visit, now on rebif. Had sleep study, showing severe OSA, but he declined CPAP therapy. Still with significant right leg pain (since summer 2015), currently on mobic, aleve. Also on aspirin and plavix for stroke prevention. No GI bleeding of stomach pain.  UPDATE 08/03/14: Since last visit, testing completed (VEP, MRIs, labs, CSF). Likely patient has multiple sclerosis in addition to prior severe chronic cerebral atherosclerosis due to HTN, DM, hyperlipidemia, smoking. No new symptsoms  PRIOR HPI (06/01/14): 70 year old right-handed male here for evaluation of abnormal MRI and brain stem lesion. 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 Patient has hypertension, diabetes, cigarette smoking. In February 2015 patient had onset of tingling and numbness in bilateral knees, with progressive weakness into bilateral thighs. Patient was evaluated with MRI lumbar spine which showed degenerative changes. He was then referred to neurosurgery noted some hyperreflexia. Patient was lowered with MRI cervical  and thoracic spine. Patient was found to have incidental brainstem lesion on MRI cervical spine. MRI brain again demonstrated this lesion with severe chronic small vessel ischemic disease. Question of possible superimposed demyelinating disease was raised. Since that time patient's numbness and weakness in legs has continued to progress. Of note patient also has some language and word finding difficulties since 2007.    REVIEW OF SYSTEMS: Full 14 system review of systems performed and notable only for as per HPI.    ALLERGIES: No Known Allergies  HOME MEDICATIONS: Outpatient Medications Prior to Visit  Medication Sig Dispense Refill  . aspirin 81 MG tablet Take 81 mg by mouth daily.    . cholecalciferol (VITAMIN D) 1000 UNITS tablet Take 1,000 Units by mouth daily.    . clopidogrel (PLAVIX) 75 MG tablet Take 75 mg by mouth daily.    . Fish Oil OIL by Does not apply route.    Marland Kitchen glimepiride (AMARYL) 4 MG tablet Take 4 mg by mouth daily with breakfast.    . Interferon Beta-1a 22 MCG/0.5ML SOSY Inject 44 mcg into the skin 3 (three) times a week. 12 Syringe 12  . lisinopril (PRINIVIL,ZESTRIL) 40 MG tablet Take 40 mg by mouth daily.    . metFORMIN (GLUMETZA) 500 MG (MOD) 24 hr tablet Take 500 mg by mouth daily with breakfast.    . metoprolol succinate (TOPROL-XL) 50 MG 24 hr tablet Take 50 mg by mouth daily. Take with or immediately following a meal.    . valsartan-hydrochlorothiazide (DIOVAN-HCT) 320-25 MG per tablet Take 1 tablet by mouth daily.    . hydrOXYzine (ATARAX/VISTARIL) 50 MG tablet Take 50 mg by mouth 3 (three) times daily as  needed.    Marland Kitchen amLODipine (NORVASC) 5 MG tablet Take 5 mg by mouth daily.    Marland Kitchen gabapentin (NEURONTIN) 100 MG capsule Take 100 mg by mouth 3 (three) times daily. Patient unsure of dose    . meloxicam (MOBIC) 15 MG tablet Take 15 mg by mouth daily.     No facility-administered medications prior to visit.     PAST MEDICAL HISTORY: Past Medical History:    Diagnosis Date  . Diabetes (HCC)   . Hyperlipemia   . Multiple sclerosis (HCC)   . Stroke [facility]) 2007    PAST SURGICAL HISTORY: Past Surgical History:  Procedure Laterality Date  . brain stent    . CATARACT EXTRACTION, BILATERAL  10/2015  . HAND TENDON SURGERY Left     FAMILY HISTORY: Family History  Problem Relation Age of Onset  . Heart attack Father   . Restless legs syndrome Father   . Stroke    . Heart attack      SOCIAL HISTORY:  Social History   Social History  . Marital status: Married    Spouse name: Bonita Quin   . Number of children: 1  . Years of education: college   Occupational History  . retired     Social History Main Topics  . Smoking status: Current Some Day Smoker  . Smokeless tobacco: Never Used     Comment: 1-2 cigarettes a day, 11/30/15 1/4- 1/2 PPD  . Alcohol use 4.2 - 8.4 oz/week    7 - 14 Standard drinks or equivalent per week     Comment: occas  . Drug use: No  . Sexual activity: Not on file   Other Topics Concern  . Not on file   Social History Narrative   Live with wife Bonita Quin   Drinks 3 cups of coffee some days      PHYSICAL EXAM  Vitals:   11/30/15 1328  BP: (!) 116/58  Pulse: 62   There is no height or weight on file to calculate BMI.  No exam data present  No flowsheet data found.  GENERAL EXAM: Patient is in no distress; well developed, nourished and groomed; neck is supple  CARDIOVASCULAR: Regular rate and rhythm, no murmurs, no carotid bruits  NEUROLOGIC: MENTAL STATUS: awake, alert, SOME MILD DECR  FLUENCY AND COMPREHENSION, naming intact, fund of knowledge appropriate CRANIAL NERVE: pupils equal and reactive to light, visual fields full to confrontation, extraocular muscles --> DIFFICULTY WITH VOLUNTARY SMOOTH PURSUIT, no nystagmus, facial sensation and strength symmetric, hearing intact, palate elevates symmetrically, uvula midline, shoulder shrug symmetric, tongue midline. MOTOR: normal bulk and tone, full  strength in the BUE, BLE SENSORY: normal and symmetric to light touch, temperature, vibration COORDINATION: finger-nose-finger, fine finger normal REFLEXES: BUE 2, KNEES 2, ANKLES 1 GAIT/STATION: narrow based gait; SLOW, SLIGHTLY ATAXIC WITH TANDEM.    DIAGNOSTIC DATA (LABS, IMAGING, TESTING) - I reviewed patient records, labs, notes, testing and imaging myself where available.  Lab Results  Component Value Date   WBC 8.6 08/03/2014   HGB 16.7 08/03/2014   HCT 48.5 08/03/2014   MCV 92 08/03/2014   PLT 202 08/03/2014      Component Value Date/Time   NA 138 08/03/2014 1524   K 4.2 08/03/2014 1524   CL 97 08/03/2014 1524   CO2 27 08/03/2014 1524   GLUCOSE 156 (H) 08/03/2014 1524   GLUCOSE 122 (H) 04/30/2008 0817   BUN 11 08/03/2014 1524   CREATININE 1.21 08/03/2014 1524   CALCIUM 9.5  08/03/2014 1524   PROT 6.9 08/03/2014 1524   ALBUMIN 4.2 08/03/2014 1524   AST 16 08/03/2014 1524   ALT 22 08/03/2014 1524   ALKPHOS 85 08/03/2014 1524   BILITOT 0.6 08/03/2014 1524   GFRNONAA 62 08/03/2014 1524   GFRAA 71 08/03/2014 1524   No results found for: CHOL, HDL, LDLCALC, LDLDIRECT, TRIG, CHOLHDL No results found for: EXBM8U Lab Results  Component Value Date   VITAMINB12 216 06/01/2014   Lab Results  Component Value Date   TSH 0.906 06/01/2014   Vit D, 25-Hydroxy  Date Value Ref Range Status  08/03/2014 17.3 (L) 30.0 - 100.0 ng/mL Final    Comment:    Vitamin D deficiency has been defined by the Institute of Medicine and an Endocrine Society practice guideline as a level of serum 25-OH vitamin D less than 20 ng/mL (1,2). The Endocrine Society went on to further define vitamin D insufficiency as a level between 21 and 29 ng/mL (2). 1. IOM (Institute of Medicine). 2010. Dietary reference    intakes for calcium and D. [place]: The    [employer]. 2. Holick MF, Binkley [place]wood, Bischoff-Ferrari HA, et al.    Evaluation, treatment, and prevention of vitamin  D    deficiency: an Endocrine Society clinical practice    guideline. JCEM. 2011 Jul; 96(7):1911-30.     02/24/14 MRI cervical spine - 10-11 mm lesion at the cervicomedullary junction in the region of the obex. The most likely diagnosis is demyelinating disease. However, the possibility of mass or infarction is not excluded. Evaluation with contrast is suggested, as described above.   02/24/14 MRI thoracic - No cord lesion in the thoracic spine. Minimal, non-compressive disc bulges.  06/02/14 visual evoked potential - There is prolongation of P100 latency with right eye stimulation, which raises possibility of dysfunction of the optic pathways anterior to the chiasm on the right side. This can be seen with compressive or demyelinating etiologies.   04/21/14 MRI brain 1. Stable appearance of an ovoid T2 hyperintense lesion within the posterior aspect of the cervicomedullary junction without enhancement. This most likely represents a demyelinating lesion. A post ischemic lesion is also considered. This does not have the appearance of a neoplasm. 2. Remote lacunar infarcts of the basal ganglia bilaterally. 3. Extensive white matter changes in diffuse atrophy. This likely represents post ischemic changes with evidence for a peripheral infarct in the left cerebellum. The differential diagnosis includes and advanced demyelinating process. 4. No acute intracranial abnormality.  06/21/14 MRI of the brain with and without contrast showing: 1. Widespread white matter signal changes in both hemispheres and in the medulla that could represent demyelination or ischemic changes. The extent is unusual in genetic diseases such as adrenoleukodystrophy and CADASIL could be considered. These changes are unchanged when compared to 04/21/2014 but have deftly progressed when compared to MRI dated 06/29/2006. 2. Since 04/21/2014, there has been the interval development of a focus in the left pons that likely represents  a lacunar infarction. It is not apparent on diffusion-weighted images and does not appear to be acute.   07/14/14 CSF - WBC 0, RBC 0, protein 67, glucose 72, oligoclonal bands > 5, cultures negative  06/01/14 labs - ANCA, ANA, TSH, b12, HIV, copper, ceruloplasmin - negative    ASSESSMENT AND PLAN  70 y.o. year old male here with severe cerebrovascular disease with uncontrolled hypertension and diabetes, prior left MCA stent and left brain TIA, now with new brainstem/cervical medullary junction lesion. Visual  evoked potention testing, CSF analysis, and progression of brain lesions, consistent with diagnosis of multiple sclerosis. The numerous brain lesions are due to combination of chronic ischemic and chronic demyelinating disease.  Dx: multiple sclerosis + severe cerebral atherosclerosis / vascular disease  Multiple sclerosis (HCC)  Cerebral atherosclerosis  OSA (obstructive sleep apnea)  Chronic fatigue  Ataxia    PLAN: - continue rebif - stop smoking - caution with gait, balance, falls, climbing, driving - recommend CPAP therapy (patient declines) - consider SSRI for mood issues (patient declines) - continue aggressive vascular risk factor reduction via PCP  Return in about 6 months (around 06/01/2016).    Suanne Marker, MD 11/30/2015, 1:50 PM Certified in Neurology, Neurophysiology and Neuroimaging  [facility] [address] [place], [place] [address] [phone]

## 2016-02-01 DIAGNOSIS — R339 Retention of urine, unspecified: Secondary | ICD-10-CM

## 2016-02-01 DIAGNOSIS — I10 Essential (primary) hypertension: Secondary | ICD-10-CM

## 2016-02-01 DIAGNOSIS — N390 Urinary tract infection, site not specified: Secondary | ICD-10-CM

## 2016-02-01 DIAGNOSIS — I639 Cerebral infarction, unspecified: Secondary | ICD-10-CM

## 2016-02-01 DIAGNOSIS — R2681 Unsteadiness on feet: Secondary | ICD-10-CM

## 2016-02-01 DIAGNOSIS — G35 Multiple sclerosis: Secondary | ICD-10-CM | POA: Diagnosis not present

## 2016-02-01 DIAGNOSIS — E119 Type 2 diabetes mellitus without complications: Secondary | ICD-10-CM

## 2016-02-02 DIAGNOSIS — N390 Urinary tract infection, site not specified: Secondary | ICD-10-CM | POA: Diagnosis not present

## 2016-02-02 DIAGNOSIS — R339 Retention of urine, unspecified: Secondary | ICD-10-CM

## 2016-02-02 DIAGNOSIS — E119 Type 2 diabetes mellitus without complications: Secondary | ICD-10-CM

## 2016-02-02 DIAGNOSIS — I639 Cerebral infarction, unspecified: Secondary | ICD-10-CM

## 2016-02-02 DIAGNOSIS — I10 Essential (primary) hypertension: Secondary | ICD-10-CM

## 2016-02-02 DIAGNOSIS — G35 Multiple sclerosis: Secondary | ICD-10-CM | POA: Diagnosis not present

## 2016-02-02 DIAGNOSIS — R2681 Unsteadiness on feet: Secondary | ICD-10-CM

## 2016-02-03 DIAGNOSIS — I639 Cerebral infarction, unspecified: Secondary | ICD-10-CM | POA: Diagnosis not present

## 2016-02-03 DIAGNOSIS — R2681 Unsteadiness on feet: Secondary | ICD-10-CM | POA: Diagnosis not present

## 2016-02-03 DIAGNOSIS — N390 Urinary tract infection, site not specified: Secondary | ICD-10-CM | POA: Diagnosis not present

## 2016-02-03 DIAGNOSIS — G35 Multiple sclerosis: Secondary | ICD-10-CM | POA: Diagnosis not present

## 2016-02-04 DIAGNOSIS — I639 Cerebral infarction, unspecified: Secondary | ICD-10-CM | POA: Diagnosis not present

## 2016-02-04 DIAGNOSIS — G35 Multiple sclerosis: Secondary | ICD-10-CM | POA: Diagnosis not present

## 2016-02-04 DIAGNOSIS — N390 Urinary tract infection, site not specified: Secondary | ICD-10-CM | POA: Diagnosis not present

## 2016-02-04 DIAGNOSIS — R2681 Unsteadiness on feet: Secondary | ICD-10-CM | POA: Diagnosis not present

## 2016-02-05 DIAGNOSIS — N390 Urinary tract infection, site not specified: Secondary | ICD-10-CM | POA: Diagnosis not present

## 2016-02-05 DIAGNOSIS — R2681 Unsteadiness on feet: Secondary | ICD-10-CM | POA: Diagnosis not present

## 2016-02-05 DIAGNOSIS — I639 Cerebral infarction, unspecified: Secondary | ICD-10-CM | POA: Diagnosis not present

## 2016-02-05 DIAGNOSIS — G35 Multiple sclerosis: Secondary | ICD-10-CM | POA: Diagnosis not present

## 2016-02-06 DIAGNOSIS — I639 Cerebral infarction, unspecified: Secondary | ICD-10-CM | POA: Diagnosis not present

## 2016-02-06 DIAGNOSIS — R2681 Unsteadiness on feet: Secondary | ICD-10-CM | POA: Diagnosis not present

## 2016-02-06 DIAGNOSIS — N390 Urinary tract infection, site not specified: Secondary | ICD-10-CM | POA: Diagnosis not present

## 2016-02-06 DIAGNOSIS — G35 Multiple sclerosis: Secondary | ICD-10-CM | POA: Diagnosis not present

## 2016-02-07 DIAGNOSIS — R2681 Unsteadiness on feet: Secondary | ICD-10-CM | POA: Diagnosis not present

## 2016-02-07 DIAGNOSIS — G35 Multiple sclerosis: Secondary | ICD-10-CM | POA: Diagnosis not present

## 2016-02-07 DIAGNOSIS — I639 Cerebral infarction, unspecified: Secondary | ICD-10-CM | POA: Diagnosis not present

## 2016-02-07 DIAGNOSIS — N390 Urinary tract infection, site not specified: Secondary | ICD-10-CM | POA: Diagnosis not present

## 2016-02-08 DIAGNOSIS — I639 Cerebral infarction, unspecified: Secondary | ICD-10-CM | POA: Diagnosis not present

## 2016-02-08 DIAGNOSIS — R2681 Unsteadiness on feet: Secondary | ICD-10-CM | POA: Diagnosis not present

## 2016-02-08 DIAGNOSIS — N390 Urinary tract infection, site not specified: Secondary | ICD-10-CM | POA: Diagnosis not present

## 2016-02-08 DIAGNOSIS — G35 Multiple sclerosis: Secondary | ICD-10-CM | POA: Diagnosis not present

## 2016-02-09 DIAGNOSIS — I639 Cerebral infarction, unspecified: Secondary | ICD-10-CM | POA: Diagnosis not present

## 2016-02-09 DIAGNOSIS — R2681 Unsteadiness on feet: Secondary | ICD-10-CM | POA: Diagnosis not present

## 2016-02-09 DIAGNOSIS — G35 Multiple sclerosis: Secondary | ICD-10-CM | POA: Diagnosis not present

## 2016-02-09 DIAGNOSIS — N390 Urinary tract infection, site not specified: Secondary | ICD-10-CM | POA: Diagnosis not present

## 2016-02-10 DIAGNOSIS — R2681 Unsteadiness on feet: Secondary | ICD-10-CM | POA: Diagnosis not present

## 2016-02-10 DIAGNOSIS — I639 Cerebral infarction, unspecified: Secondary | ICD-10-CM | POA: Diagnosis not present

## 2016-02-10 DIAGNOSIS — G35 Multiple sclerosis: Secondary | ICD-10-CM | POA: Diagnosis not present

## 2016-02-10 DIAGNOSIS — N390 Urinary tract infection, site not specified: Secondary | ICD-10-CM | POA: Diagnosis not present

## 2016-02-14 ENCOUNTER — Telehealth: Payer: Self-pay | Admitting: Diagnostic Neuroimaging

## 2016-02-14 NOTE — Telephone Encounter (Signed)
Patient's wife is calling to advise the patient had a stroke on 01-31-16 and was hospitalized at [facility] in [place] for 7 days. The patient is now at [facility] for rehab. A returned call is not needed unless there are questions.

## 2016-02-22 ENCOUNTER — Telehealth: Payer: Self-pay | Admitting: Diagnostic Neuroimaging

## 2016-02-22 DIAGNOSIS — R339 Retention of urine, unspecified: Secondary | ICD-10-CM

## 2016-02-22 DIAGNOSIS — E119 Type 2 diabetes mellitus without complications: Secondary | ICD-10-CM

## 2016-02-22 DIAGNOSIS — J9600 Acute respiratory failure, unspecified whether with hypoxia or hypercapnia: Secondary | ICD-10-CM | POA: Diagnosis not present

## 2016-02-22 DIAGNOSIS — N289 Disorder of kidney and ureter, unspecified: Secondary | ICD-10-CM | POA: Diagnosis not present

## 2016-02-22 DIAGNOSIS — R2681 Unsteadiness on feet: Secondary | ICD-10-CM | POA: Diagnosis not present

## 2016-02-22 DIAGNOSIS — D649 Anemia, unspecified: Secondary | ICD-10-CM

## 2016-02-22 DIAGNOSIS — I10 Essential (primary) hypertension: Secondary | ICD-10-CM

## 2016-02-22 DIAGNOSIS — G35 Multiple sclerosis: Secondary | ICD-10-CM

## 2016-02-22 NOTE — Telephone Encounter (Signed)
Noted  

## 2016-02-22 NOTE — Telephone Encounter (Signed)
[facility] called to let the office know pts wife has notified them pt is receiving Rebif from SNF.  Phone: [phone]

## 2016-02-23 DIAGNOSIS — R2681 Unsteadiness on feet: Secondary | ICD-10-CM | POA: Diagnosis not present

## 2016-02-23 DIAGNOSIS — J9600 Acute respiratory failure, unspecified whether with hypoxia or hypercapnia: Secondary | ICD-10-CM | POA: Diagnosis not present

## 2016-02-23 DIAGNOSIS — N289 Disorder of kidney and ureter, unspecified: Secondary | ICD-10-CM | POA: Diagnosis not present

## 2016-02-23 DIAGNOSIS — D649 Anemia, unspecified: Secondary | ICD-10-CM | POA: Diagnosis not present

## 2016-02-24 DIAGNOSIS — R2681 Unsteadiness on feet: Secondary | ICD-10-CM | POA: Diagnosis not present

## 2016-02-24 DIAGNOSIS — J9600 Acute respiratory failure, unspecified whether with hypoxia or hypercapnia: Secondary | ICD-10-CM | POA: Diagnosis not present

## 2016-02-24 DIAGNOSIS — N289 Disorder of kidney and ureter, unspecified: Secondary | ICD-10-CM | POA: Diagnosis not present

## 2016-02-24 DIAGNOSIS — D649 Anemia, unspecified: Secondary | ICD-10-CM | POA: Diagnosis not present

## 2016-02-25 DIAGNOSIS — N289 Disorder of kidney and ureter, unspecified: Secondary | ICD-10-CM | POA: Diagnosis not present

## 2016-02-25 DIAGNOSIS — R2681 Unsteadiness on feet: Secondary | ICD-10-CM | POA: Diagnosis not present

## 2016-02-25 DIAGNOSIS — J9600 Acute respiratory failure, unspecified whether with hypoxia or hypercapnia: Secondary | ICD-10-CM | POA: Diagnosis not present

## 2016-02-25 DIAGNOSIS — D649 Anemia, unspecified: Secondary | ICD-10-CM | POA: Diagnosis not present

## 2016-02-26 DIAGNOSIS — R2681 Unsteadiness on feet: Secondary | ICD-10-CM | POA: Diagnosis not present

## 2016-02-26 DIAGNOSIS — D649 Anemia, unspecified: Secondary | ICD-10-CM | POA: Diagnosis not present

## 2016-02-26 DIAGNOSIS — J9600 Acute respiratory failure, unspecified whether with hypoxia or hypercapnia: Secondary | ICD-10-CM | POA: Diagnosis not present

## 2016-02-26 DIAGNOSIS — N289 Disorder of kidney and ureter, unspecified: Secondary | ICD-10-CM | POA: Diagnosis not present

## 2016-02-27 DIAGNOSIS — D649 Anemia, unspecified: Secondary | ICD-10-CM | POA: Diagnosis not present

## 2016-02-27 DIAGNOSIS — N289 Disorder of kidney and ureter, unspecified: Secondary | ICD-10-CM | POA: Diagnosis not present

## 2016-02-27 DIAGNOSIS — J9600 Acute respiratory failure, unspecified whether with hypoxia or hypercapnia: Secondary | ICD-10-CM | POA: Diagnosis not present

## 2016-02-27 DIAGNOSIS — R2681 Unsteadiness on feet: Secondary | ICD-10-CM | POA: Diagnosis not present

## 2016-02-28 DIAGNOSIS — R2681 Unsteadiness on feet: Secondary | ICD-10-CM | POA: Diagnosis not present

## 2016-02-28 DIAGNOSIS — J9600 Acute respiratory failure, unspecified whether with hypoxia or hypercapnia: Secondary | ICD-10-CM | POA: Diagnosis not present

## 2016-02-28 DIAGNOSIS — N289 Disorder of kidney and ureter, unspecified: Secondary | ICD-10-CM | POA: Diagnosis not present

## 2016-02-28 DIAGNOSIS — D649 Anemia, unspecified: Secondary | ICD-10-CM | POA: Diagnosis not present

## 2016-02-29 DIAGNOSIS — Z8673 Personal history of transient ischemic attack (TIA), and cerebral infarction without residual deficits: Secondary | ICD-10-CM | POA: Diagnosis not present

## 2016-02-29 DIAGNOSIS — N183 Chronic kidney disease, stage 3 (moderate): Secondary | ICD-10-CM

## 2016-02-29 DIAGNOSIS — E119 Type 2 diabetes mellitus without complications: Secondary | ICD-10-CM

## 2016-02-29 DIAGNOSIS — J90 Pleural effusion, not elsewhere classified: Secondary | ICD-10-CM | POA: Diagnosis not present

## 2016-02-29 DIAGNOSIS — G35 Multiple sclerosis: Secondary | ICD-10-CM

## 2016-02-29 DIAGNOSIS — I10 Essential (primary) hypertension: Secondary | ICD-10-CM

## 2016-02-29 DIAGNOSIS — J9601 Acute respiratory failure with hypoxia: Secondary | ICD-10-CM

## 2016-03-01 DIAGNOSIS — J9601 Acute respiratory failure with hypoxia: Secondary | ICD-10-CM | POA: Diagnosis not present

## 2016-03-01 DIAGNOSIS — Z8673 Personal history of transient ischemic attack (TIA), and cerebral infarction without residual deficits: Secondary | ICD-10-CM | POA: Diagnosis not present

## 2016-03-01 DIAGNOSIS — J90 Pleural effusion, not elsewhere classified: Secondary | ICD-10-CM | POA: Diagnosis not present

## 2016-03-01 DIAGNOSIS — N183 Chronic kidney disease, stage 3 (moderate): Secondary | ICD-10-CM | POA: Diagnosis not present

## 2016-03-02 DIAGNOSIS — J90 Pleural effusion, not elsewhere classified: Secondary | ICD-10-CM | POA: Diagnosis not present

## 2016-03-02 DIAGNOSIS — N183 Chronic kidney disease, stage 3 (moderate): Secondary | ICD-10-CM | POA: Diagnosis not present

## 2016-03-02 DIAGNOSIS — Z8673 Personal history of transient ischemic attack (TIA), and cerebral infarction without residual deficits: Secondary | ICD-10-CM | POA: Diagnosis not present

## 2016-03-02 DIAGNOSIS — J9601 Acute respiratory failure with hypoxia: Secondary | ICD-10-CM | POA: Diagnosis not present

## 2016-03-03 DIAGNOSIS — Z8673 Personal history of transient ischemic attack (TIA), and cerebral infarction without residual deficits: Secondary | ICD-10-CM | POA: Diagnosis not present

## 2016-03-03 DIAGNOSIS — J9601 Acute respiratory failure with hypoxia: Secondary | ICD-10-CM | POA: Diagnosis not present

## 2016-03-03 DIAGNOSIS — J90 Pleural effusion, not elsewhere classified: Secondary | ICD-10-CM | POA: Diagnosis not present

## 2016-03-03 DIAGNOSIS — N183 Chronic kidney disease, stage 3 (moderate): Secondary | ICD-10-CM | POA: Diagnosis not present

## 2016-03-04 DIAGNOSIS — J90 Pleural effusion, not elsewhere classified: Secondary | ICD-10-CM | POA: Diagnosis not present

## 2016-03-04 DIAGNOSIS — Z8673 Personal history of transient ischemic attack (TIA), and cerebral infarction without residual deficits: Secondary | ICD-10-CM | POA: Diagnosis not present

## 2016-03-04 DIAGNOSIS — N183 Chronic kidney disease, stage 3 (moderate): Secondary | ICD-10-CM | POA: Diagnosis not present

## 2016-03-04 DIAGNOSIS — J9601 Acute respiratory failure with hypoxia: Secondary | ICD-10-CM | POA: Diagnosis not present

## 2016-03-05 DIAGNOSIS — J9601 Acute respiratory failure with hypoxia: Secondary | ICD-10-CM | POA: Diagnosis not present

## 2016-03-05 DIAGNOSIS — N183 Chronic kidney disease, stage 3 (moderate): Secondary | ICD-10-CM | POA: Diagnosis not present

## 2016-03-05 DIAGNOSIS — J90 Pleural effusion, not elsewhere classified: Secondary | ICD-10-CM | POA: Diagnosis not present

## 2016-03-05 DIAGNOSIS — Z8673 Personal history of transient ischemic attack (TIA), and cerebral infarction without residual deficits: Secondary | ICD-10-CM | POA: Diagnosis not present

## 2016-03-06 ENCOUNTER — Telehealth: Payer: Self-pay | Admitting: Diagnostic Neuroimaging

## 2016-03-06 DIAGNOSIS — N183 Chronic kidney disease, stage 3 (moderate): Secondary | ICD-10-CM | POA: Diagnosis not present

## 2016-03-06 DIAGNOSIS — J90 Pleural effusion, not elsewhere classified: Secondary | ICD-10-CM | POA: Diagnosis not present

## 2016-03-06 DIAGNOSIS — J9601 Acute respiratory failure with hypoxia: Secondary | ICD-10-CM | POA: Diagnosis not present

## 2016-03-06 DIAGNOSIS — Z8673 Personal history of transient ischemic attack (TIA), and cerebral infarction without residual deficits: Secondary | ICD-10-CM | POA: Diagnosis not present

## 2016-03-06 DIAGNOSIS — R55 Syncope and collapse: Secondary | ICD-10-CM

## 2016-03-06 NOTE — Telephone Encounter (Signed)
I called back Dr. Lucianne Muss. Patient was admitted last month to [facility] for stroke. Patient then was readmitted for shortness of breath and fevers, diagnosed with pneumonia and pleural effusion. Patient has been poorly responsive to broad-spectrum antibiotics and thoracentesis. Patient now may have superimposed pulmonary embolism. Dr. Lucianne Muss and patient's wife asking if Rebif may be temporarily discontinued while patient is dealing with these other acute medical illnesses. I agree with plan. Okay to hold Rebif for now.    Suanne Marker, MD 03/06/2016, 12:51 PM Certified in Neurology, Neurophysiology and Neuroimaging  [facility] [address] [place], [place] [address] [phone]

## 2016-03-06 NOTE — Telephone Encounter (Signed)
Dr. [facility] [phone] called to request call back from Dr. Marjory Lies to discuss patient's medication REBIF, states patient had stroke then rehab, is currently admitted to [facility] for pleural effusion and pneumonia.

## 2016-03-07 DIAGNOSIS — N183 Chronic kidney disease, stage 3 (moderate): Secondary | ICD-10-CM

## 2016-03-07 DIAGNOSIS — J90 Pleural effusion, not elsewhere classified: Secondary | ICD-10-CM | POA: Diagnosis not present

## 2016-03-07 DIAGNOSIS — I10 Essential (primary) hypertension: Secondary | ICD-10-CM

## 2016-03-07 DIAGNOSIS — G35 Multiple sclerosis: Secondary | ICD-10-CM

## 2016-03-07 DIAGNOSIS — J9601 Acute respiratory failure with hypoxia: Secondary | ICD-10-CM | POA: Diagnosis not present

## 2016-03-07 DIAGNOSIS — E119 Type 2 diabetes mellitus without complications: Secondary | ICD-10-CM

## 2016-03-07 DIAGNOSIS — Z8673 Personal history of transient ischemic attack (TIA), and cerebral infarction without residual deficits: Secondary | ICD-10-CM

## 2016-03-08 DIAGNOSIS — N183 Chronic kidney disease, stage 3 (moderate): Secondary | ICD-10-CM | POA: Diagnosis not present

## 2016-03-08 DIAGNOSIS — Z8673 Personal history of transient ischemic attack (TIA), and cerebral infarction without residual deficits: Secondary | ICD-10-CM | POA: Diagnosis not present

## 2016-03-08 DIAGNOSIS — R55 Syncope and collapse: Secondary | ICD-10-CM

## 2016-03-08 DIAGNOSIS — J90 Pleural effusion, not elsewhere classified: Secondary | ICD-10-CM | POA: Diagnosis not present

## 2016-03-08 DIAGNOSIS — J9601 Acute respiratory failure with hypoxia: Secondary | ICD-10-CM | POA: Diagnosis not present

## 2016-03-09 ENCOUNTER — Encounter (HOSPITAL_COMMUNITY): Payer: Self-pay | Admitting: Internal Medicine

## 2016-03-09 ENCOUNTER — Inpatient Hospital Stay (HOSPITAL_COMMUNITY): Payer: PPO

## 2016-03-09 ENCOUNTER — Inpatient Hospital Stay (HOSPITAL_COMMUNITY)
Admission: AD | Admit: 2016-03-09 | Discharge: 2016-04-23 | DRG: 870 | Disposition: E | Payer: PPO | Source: Other Acute Inpatient Hospital | Attending: Nephrology | Admitting: Nephrology

## 2016-03-09 DIAGNOSIS — I129 Hypertensive chronic kidney disease with stage 1 through stage 4 chronic kidney disease, or unspecified chronic kidney disease: Secondary | ICD-10-CM | POA: Diagnosis present

## 2016-03-09 DIAGNOSIS — N179 Acute kidney failure, unspecified: Secondary | ICD-10-CM

## 2016-03-09 DIAGNOSIS — G35 Multiple sclerosis: Secondary | ICD-10-CM | POA: Diagnosis present

## 2016-03-09 DIAGNOSIS — I700 Atherosclerosis of aorta: Secondary | ICD-10-CM | POA: Diagnosis present

## 2016-03-09 DIAGNOSIS — I69354 Hemiplegia and hemiparesis following cerebral infarction affecting left non-dominant side: Secondary | ICD-10-CM | POA: Diagnosis not present

## 2016-03-09 DIAGNOSIS — R6521 Severe sepsis with septic shock: Secondary | ICD-10-CM | POA: Diagnosis present

## 2016-03-09 DIAGNOSIS — J189 Pneumonia, unspecified organism: Secondary | ICD-10-CM | POA: Diagnosis present

## 2016-03-09 DIAGNOSIS — N170 Acute kidney failure with tubular necrosis: Secondary | ICD-10-CM | POA: Diagnosis not present

## 2016-03-09 DIAGNOSIS — D649 Anemia, unspecified: Secondary | ICD-10-CM | POA: Diagnosis present

## 2016-03-09 DIAGNOSIS — Z8673 Personal history of transient ischemic attack (TIA), and cerebral infarction without residual deficits: Secondary | ICD-10-CM

## 2016-03-09 DIAGNOSIS — Z452 Encounter for adjustment and management of vascular access device: Secondary | ICD-10-CM | POA: Diagnosis not present

## 2016-03-09 DIAGNOSIS — E1122 Type 2 diabetes mellitus with diabetic chronic kidney disease: Secondary | ICD-10-CM | POA: Diagnosis present

## 2016-03-09 DIAGNOSIS — Z9842 Cataract extraction status, left eye: Secondary | ICD-10-CM | POA: Diagnosis not present

## 2016-03-09 DIAGNOSIS — Z8249 Family history of ischemic heart disease and other diseases of the circulatory system: Secondary | ICD-10-CM

## 2016-03-09 DIAGNOSIS — Z9841 Cataract extraction status, right eye: Secondary | ICD-10-CM

## 2016-03-09 DIAGNOSIS — Z4682 Encounter for fitting and adjustment of non-vascular catheter: Secondary | ICD-10-CM | POA: Diagnosis not present

## 2016-03-09 DIAGNOSIS — J9602 Acute respiratory failure with hypercapnia: Secondary | ICD-10-CM | POA: Diagnosis not present

## 2016-03-09 DIAGNOSIS — A419 Sepsis, unspecified organism: Secondary | ICD-10-CM | POA: Diagnosis present

## 2016-03-09 DIAGNOSIS — J440 Chronic obstructive pulmonary disease with acute lower respiratory infection: Secondary | ICD-10-CM | POA: Diagnosis present

## 2016-03-09 DIAGNOSIS — J80 Acute respiratory distress syndrome: Secondary | ICD-10-CM | POA: Diagnosis present

## 2016-03-09 DIAGNOSIS — R011 Cardiac murmur, unspecified: Secondary | ICD-10-CM | POA: Diagnosis not present

## 2016-03-09 DIAGNOSIS — J869 Pyothorax without fistula: Secondary | ICD-10-CM | POA: Diagnosis present

## 2016-03-09 DIAGNOSIS — Z7984 Long term (current) use of oral hypoglycemic drugs: Secondary | ICD-10-CM

## 2016-03-09 DIAGNOSIS — E785 Hyperlipidemia, unspecified: Secondary | ICD-10-CM | POA: Diagnosis present

## 2016-03-09 DIAGNOSIS — J939 Pneumothorax, unspecified: Secondary | ICD-10-CM | POA: Diagnosis not present

## 2016-03-09 DIAGNOSIS — Z66 Do not resuscitate: Secondary | ICD-10-CM

## 2016-03-09 DIAGNOSIS — Z7902 Long term (current) use of antithrombotics/antiplatelets: Secondary | ICD-10-CM | POA: Diagnosis not present

## 2016-03-09 DIAGNOSIS — J9600 Acute respiratory failure, unspecified whether with hypoxia or hypercapnia: Secondary | ICD-10-CM

## 2016-03-09 DIAGNOSIS — N400 Enlarged prostate without lower urinary tract symptoms: Secondary | ICD-10-CM | POA: Diagnosis present

## 2016-03-09 DIAGNOSIS — R0609 Other forms of dyspnea: Secondary | ICD-10-CM | POA: Diagnosis not present

## 2016-03-09 DIAGNOSIS — Z515 Encounter for palliative care: Secondary | ICD-10-CM

## 2016-03-09 DIAGNOSIS — Z7982 Long term (current) use of aspirin: Secondary | ICD-10-CM

## 2016-03-09 DIAGNOSIS — J90 Pleural effusion, not elsewhere classified: Secondary | ICD-10-CM | POA: Diagnosis present

## 2016-03-09 DIAGNOSIS — E874 Mixed disorder of acid-base balance: Secondary | ICD-10-CM | POA: Diagnosis present

## 2016-03-09 DIAGNOSIS — K5900 Constipation, unspecified: Secondary | ICD-10-CM | POA: Diagnosis not present

## 2016-03-09 DIAGNOSIS — I959 Hypotension, unspecified: Secondary | ICD-10-CM | POA: Diagnosis present

## 2016-03-09 DIAGNOSIS — I69319 Unspecified symptoms and signs involving cognitive functions following cerebral infarction: Secondary | ICD-10-CM

## 2016-03-09 DIAGNOSIS — J9601 Acute respiratory failure with hypoxia: Secondary | ICD-10-CM | POA: Diagnosis not present

## 2016-03-09 DIAGNOSIS — N183 Chronic kidney disease, stage 3 (moderate): Secondary | ICD-10-CM | POA: Diagnosis not present

## 2016-03-09 DIAGNOSIS — E861 Hypovolemia: Secondary | ICD-10-CM | POA: Diagnosis not present

## 2016-03-09 DIAGNOSIS — E875 Hyperkalemia: Secondary | ICD-10-CM | POA: Diagnosis not present

## 2016-03-09 DIAGNOSIS — R609 Edema, unspecified: Secondary | ICD-10-CM | POA: Diagnosis not present

## 2016-03-09 DIAGNOSIS — Z978 Presence of other specified devices: Secondary | ICD-10-CM

## 2016-03-09 DIAGNOSIS — Z4659 Encounter for fitting and adjustment of other gastrointestinal appliance and device: Secondary | ICD-10-CM

## 2016-03-09 DIAGNOSIS — N182 Chronic kidney disease, stage 2 (mild): Secondary | ICD-10-CM | POA: Diagnosis present

## 2016-03-09 DIAGNOSIS — R0600 Dyspnea, unspecified: Secondary | ICD-10-CM

## 2016-03-09 DIAGNOSIS — L8990 Pressure ulcer of unspecified site, unspecified stage: Secondary | ICD-10-CM | POA: Insufficient documentation

## 2016-03-09 DIAGNOSIS — E872 Acidosis: Secondary | ICD-10-CM | POA: Diagnosis present

## 2016-03-09 DIAGNOSIS — R10819 Abdominal tenderness, unspecified site: Secondary | ICD-10-CM

## 2016-03-09 LAB — POCT I-STAT 3, ART BLOOD GAS (G3+)
ACID-BASE DEFICIT: 13 mmol/L — AB (ref 0.0–2.0)
BICARBONATE: 15.2 mmol/L — AB (ref 20.0–28.0)
O2 SAT: 96 %
PH ART: 7.164 — AB (ref 7.350–7.450)
TCO2: 17 mmol/L (ref 0–100)
pCO2 arterial: 42.4 mmHg (ref 32.0–48.0)
pO2, Arterial: 103 mmHg (ref 83.0–108.0)

## 2016-03-09 LAB — CBC
HCT: 19.5 % — ABNORMAL LOW (ref 39.0–52.0)
HEMOGLOBIN: 6.3 g/dL — AB (ref 13.0–17.0)
MCH: 27.5 pg (ref 26.0–34.0)
MCHC: 32.3 g/dL (ref 30.0–36.0)
MCV: 85.2 fL (ref 78.0–100.0)
PLATELETS: 179 10*3/uL (ref 150–400)
RBC: 2.29 MIL/uL — AB (ref 4.22–5.81)
RDW: 14.7 % (ref 11.5–15.5)
WBC: 10.7 10*3/uL — AB (ref 4.0–10.5)

## 2016-03-09 LAB — URINALYSIS, ROUTINE W REFLEX MICROSCOPIC
GLUCOSE, UA: NEGATIVE mg/dL
KETONES UR: 15 mg/dL — AB
Nitrite: NEGATIVE
PROTEIN: 100 mg/dL — AB
Specific Gravity, Urine: 1.028 (ref 1.005–1.030)
pH: 5 (ref 5.0–8.0)

## 2016-03-09 LAB — BASIC METABOLIC PANEL
ANION GAP: 9 (ref 5–15)
BUN: 39 mg/dL — AB (ref 6–20)
CHLORIDE: 110 mmol/L (ref 101–111)
CO2: 17 mmol/L — ABNORMAL LOW (ref 22–32)
Calcium: 7.6 mg/dL — ABNORMAL LOW (ref 8.9–10.3)
Creatinine, Ser: 3.66 mg/dL — ABNORMAL HIGH (ref 0.61–1.24)
GFR, EST AFRICAN AMERICAN: 18 mL/min — AB (ref >60)
GFR, EST NON AFRICAN AMERICAN: 16 mL/min — AB (ref >60)
Glucose, Bld: 95 mg/dL (ref 65–99)
POTASSIUM: 4.2 mmol/L (ref 3.5–5.1)
SODIUM: 136 mmol/L (ref 135–145)

## 2016-03-09 LAB — URINE MICROSCOPIC-ADD ON

## 2016-03-09 LAB — MAGNESIUM: MAGNESIUM: 1.8 mg/dL (ref 1.7–2.4)

## 2016-03-09 LAB — GLUCOSE, CAPILLARY: Glucose-Capillary: 96 mg/dL (ref 65–99)

## 2016-03-09 LAB — PHOSPHORUS: PHOSPHORUS: 5.6 mg/dL — AB (ref 2.5–4.6)

## 2016-03-09 LAB — MRSA PCR SCREENING: MRSA BY PCR: NEGATIVE

## 2016-03-09 LAB — TROPONIN I

## 2016-03-09 MED ORDER — PROPOFOL 1000 MG/100ML IV EMUL
5.0000 ug/kg/min | INTRAVENOUS | Status: DC
  Administered 2016-03-09: 30 ug/kg/min via INTRAVENOUS
  Administered 2016-03-10: 12 ug/kg/min via INTRAVENOUS
  Filled 2016-03-09 (×2): qty 100

## 2016-03-09 MED ORDER — SODIUM CHLORIDE 0.9 % IV SOLN
500.0000 mg | Freq: Two times a day (BID) | INTRAVENOUS | Status: DC
  Administered 2016-03-10 – 2016-03-14 (×10): 500 mg via INTRAVENOUS
  Filled 2016-03-09 (×12): qty 0.5

## 2016-03-09 MED ORDER — ASPIRIN 300 MG RE SUPP: 300.0000 mg | RECTAL | Status: AC

## 2016-03-09 MED ORDER — ORAL CARE MOUTH RINSE
15.0000 mL | OROMUCOSAL | Status: DC
  Administered 2016-03-10 – 2016-03-16 (×63): 15 mL via OROMUCOSAL

## 2016-03-09 MED ORDER — CHLORHEXIDINE GLUCONATE 0.12% ORAL RINSE (MEDLINE KIT)
15.0000 mL | Freq: Two times a day (BID) | OROMUCOSAL | Status: DC
  Administered 2016-03-10 – 2016-03-16 (×14): 15 mL via OROMUCOSAL

## 2016-03-09 MED ORDER — SODIUM CHLORIDE 0.9 % IV SOLN
250.0000 mL | INTRAVENOUS | Status: DC | PRN
  Administered 2016-03-09: 250 mL via INTRAVENOUS

## 2016-03-09 MED ORDER — SODIUM CHLORIDE 0.9 % IV BOLUS (SEPSIS)
500.0000 mL | Freq: Once | INTRAVENOUS | Status: AC
  Administered 2016-03-09: 500 mL via INTRAVENOUS

## 2016-03-09 MED ORDER — ASPIRIN 81 MG PO CHEW
324.0000 mg | CHEWABLE_TABLET | ORAL | Status: AC
  Administered 2016-03-09: 324 mg via ORAL
  Filled 2016-03-09: qty 4

## 2016-03-09 MED ORDER — FENTANYL 2500MCG IN NS 250ML (10MCG/ML) PREMIX INFUSION
0.0000 ug/h | INTRAVENOUS | Status: DC
  Administered 2016-03-09: 100 ug/h via INTRAVENOUS
  Administered 2016-03-10 (×2): 250 ug/h via INTRAVENOUS
  Administered 2016-03-11 – 2016-03-12 (×2): 200 ug/h via INTRAVENOUS
  Administered 2016-03-12: 300 ug/h via INTRAVENOUS
  Administered 2016-03-13 – 2016-03-14 (×2): 50 ug/h via INTRAVENOUS
  Administered 2016-03-15: 75 ug/h via INTRAVENOUS
  Administered 2016-03-16: 175 ug/h via INTRAVENOUS
  Administered 2016-03-17: 100 ug/h via INTRAVENOUS
  Administered 2016-03-17: 75 ug/h via INTRAVENOUS
  Administered 2016-03-17: 25 ug/h via INTRAVENOUS
  Administered 2016-03-19: 50 ug/h via INTRAVENOUS
  Filled 2016-03-09 (×13): qty 250

## 2016-03-09 MED ORDER — HEPARIN SODIUM (PORCINE) 5000 UNIT/ML IJ SOLN
5000.0000 [IU] | Freq: Three times a day (TID) | INTRAMUSCULAR | Status: DC
  Administered 2016-03-09: 5000 [IU] via SUBCUTANEOUS
  Filled 2016-03-09: qty 1

## 2016-03-09 MED ORDER — INSULIN ASPART 100 UNIT/ML ~~LOC~~ SOLN
1.0000 [IU] | SUBCUTANEOUS | Status: DC
  Administered 2016-03-10: 1 [IU] via SUBCUTANEOUS
  Administered 2016-03-10: 2 [IU] via SUBCUTANEOUS
  Administered 2016-03-11 (×3): 1 [IU] via SUBCUTANEOUS
  Administered 2016-03-12 (×4): 2 [IU] via SUBCUTANEOUS
  Administered 2016-03-15 (×5): 1 [IU] via SUBCUTANEOUS
  Administered 2016-03-16: 2 [IU] via SUBCUTANEOUS
  Administered 2016-03-16 – 2016-03-17 (×4): 1 [IU] via SUBCUTANEOUS
  Administered 2016-03-17 (×2): 2 [IU] via SUBCUTANEOUS
  Administered 2016-03-17 (×2): 1 [IU] via SUBCUTANEOUS
  Administered 2016-03-17: 2 [IU] via SUBCUTANEOUS
  Administered 2016-03-18 – 2016-03-19 (×11): 1 [IU] via SUBCUTANEOUS

## 2016-03-09 MED ORDER — IPRATROPIUM-ALBUTEROL 0.5-2.5 (3) MG/3ML IN SOLN
3.0000 mL | Freq: Four times a day (QID) | RESPIRATORY_TRACT | Status: DC
  Administered 2016-03-09 – 2016-03-19 (×41): 3 mL via RESPIRATORY_TRACT
  Filled 2016-03-09 (×40): qty 3

## 2016-03-09 MED ORDER — SODIUM CHLORIDE 0.9 % IV SOLN
Freq: Once | INTRAVENOUS | Status: AC
  Administered 2016-03-10: 02:00:00 via INTRAVENOUS

## 2016-03-09 MED ORDER — FAMOTIDINE IN NACL 20-0.9 MG/50ML-% IV SOLN
20.0000 mg | Freq: Two times a day (BID) | INTRAVENOUS | Status: DC
  Administered 2016-03-09: 20 mg via INTRAVENOUS
  Filled 2016-03-09 (×2): qty 50

## 2016-03-09 MED ORDER — ALBUTEROL SULFATE (2.5 MG/3ML) 0.083% IN NEBU: 2.5000 mg | INHALATION_SOLUTION | RESPIRATORY_TRACT | Status: DC | PRN

## 2016-03-09 MED ORDER — FAMOTIDINE IN NACL 20-0.9 MG/50ML-% IV SOLN
20.0000 mg | INTRAVENOUS | Status: DC
  Administered 2016-03-10: 20 mg via INTRAVENOUS
  Filled 2016-03-09 (×2): qty 50

## 2016-03-09 MED ORDER — PROPOFOL 1000 MG/100ML IV EMUL: 5.0000 ug/kg/min | INTRAVENOUS | Status: DC

## 2016-03-09 MED ORDER — NOREPINEPHRINE BITARTRATE 1 MG/ML IV SOLN
0.0000 ug/min | INTRAVENOUS | Status: DC
  Administered 2016-03-09: 3 ug/min via INTRAVENOUS
  Administered 2016-03-10: 10 ug/min via INTRAVENOUS
  Filled 2016-03-09 (×3): qty 4

## 2016-03-09 MED ORDER — FAMOTIDINE IN NACL 20-0.9 MG/50ML-% IV SOLN
20.0000 mg | Freq: Two times a day (BID) | INTRAVENOUS | Status: DC
  Filled 2016-03-09: qty 50

## 2016-03-09 NOTE — Progress Notes (Signed)
eLink Physician-Brief Progress Note Patient Name: QUENTEZ CASTLETON DOB: 10-26-1945 MRN: [id number]   Date of Service  03/01/2016  HPI/Events of Note  Patient arrives in transfer from [facility]. No information in EPIC.  eICU Interventions  Will order: 1. Norepinephrine IV infusion. Titrate to MAP >= 65. 2. Fentanyl and Propofol IV infusions. Titrate to RASS = 0 to -1. 3. Pepcid 20 mg IV Q 12 hours.  4. Ventilator settings: 60%/PRVC 16/TV 570/P 5. 5. ABG at 7:30 PM.         Lenell Antu 03/20/2016, 6:34 PM

## 2016-03-09 NOTE — Progress Notes (Signed)
eLink Physician-Brief Progress Note Patient Name: Duane Harper DOB: 1945/11/25 MRN: [id number]   Date of Service  03/01/2016  HPI/Events of Note  Notified by respiratory therapy regarding ABG w/ pH 7.16 & pCO2 42.4. Serum Bicarb 15. Rate set at 16 & over breathing to 22.   eICU Interventions  1. Increase Set Rate to 28 2. ABG at 0100 11/18     Intervention Category Major Interventions: Acid-Base disturbance - evaluation and management;Respiratory failure - evaluation and management  Lawanda CousinsJennings Ranveer Wahlstrom 03/14/2016, 11:27 PM

## 2016-03-09 NOTE — Progress Notes (Signed)
CRITICAL VALUE ALERT  Critical value received: Hemoglobin 6.3  Date of notification: 2016-04-22   Time of notification:  23:05   Critical value read back: yes   Nurse who received alert:  Nechama GuardJennifer Aphrodite Harpenau  MD notified (1st page):  Earlene PlaterWallace  Time of first page:  23:30  Responding MD: Earlene PlaterWallace  Time MD responded:  23:30

## 2016-03-09 NOTE — H&P (Signed)
PULMONARY / CRITICAL CARE MEDICINE   Name: Duane Harper MRN: [id number] DOB: 12-30-1945    ADMISSION DATE:  03-16-2016  REFERRING MD:  [facility]   CHIEF COMPLAINT:  Respiratory Failure   HISTORY OF PRESENT ILLNESS:  History obtained from wife and records review as patient is sedated/intubated.   Duane Harper is 70 y.o. 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 Had left sided deficits from this CVA as well as cognitive changes. While undergoing rehab for his CVA, he developed dyspnea. Was diagnosed with pneumonia and bilateral pleural effusions. Patient was started on Levaquin and required supplemental O2. Patient's pleural fluid isolated Citrobacter. Fluid analysis suggested transudate process. Patient was treated with Lasix and antibiotics were broadened.  On 11/13, patient had a syncopal episode while participating in PT. He was found to be hypotensive at that time. Patient continued to remain febrile with worsening hypoxia despite supplemental O2 and abx coverage. CTA chest obtained which was negative for PE and slight progression of severe multilobar PNA compared to prior CT.   On 11/14, patient stated he desired intubation as his WOB was significant on BiPAP. Bronchoscopy was performed which showed no endobronchial lesions and minimal inflammation of the bronchi in the left upper lobe. Bronchial cultures showed normal oral respiratory flora.   Had discussion with wife regarding patient's wishes. She reports that he would not want to remain on ventilator for long term. He would not wish to have a tracheostomy or a PEG tube. His wish would be to be a DNR. He would not want chest compressions or defibrillation.   PAST MEDICAL HISTORY :  He  has a past medical history of Diabetes (HCC); Hyperlipemia; Multiple sclerosis (HCC); and Stroke (HCC) (2007).  PAST SURGICAL HISTORY: He  has a past surgical history that includes brain stent; Cataract  extraction, bilateral (10/2015); and Hand tendon surgery (Left).  No Known Allergies  No current facility-administered medications on file prior to encounter.    Current Outpatient Prescriptions on File Prior to Encounter  Medication Sig  . amLODipine (NORVASC) 10 MG tablet 10 mg daily.  Marland Kitchen aspirin 81 MG tablet Take 81 mg by mouth daily.  . cholecalciferol (VITAMIN D) 1000 UNITS tablet Take 1,000 Units by mouth daily.  . clopidogrel (PLAVIX) 75 MG tablet Take 75 mg by mouth daily.  . Fish Oil OIL by Does not apply route.  . gabapentin (NEURONTIN) 300 MG capsule 300 mg daily.  Marland Kitchen glimepiride (AMARYL) 4 MG tablet Take 4 mg by mouth daily with breakfast.  . hydrALAZINE (APRESOLINE) 50 MG tablet 50 mg 2 (two) times daily.  . Interferon Beta-1a 22 MCG/0.5ML SOSY Inject 44 mcg into the skin 3 (three) times a week.  Marland Kitchen lisinopril (PRINIVIL,ZESTRIL) 40 MG tablet Take 40 mg by mouth daily.  . metFORMIN (GLUMETZA) 500 MG (MOD) 24 hr tablet Take 500 mg by mouth daily with breakfast.  . metoprolol succinate (TOPROL-XL) 50 MG 24 hr tablet Take 50 mg by mouth daily. Take with or immediately following a meal.  . valsartan-hydrochlorothiazide (DIOVAN-HCT) 320-25 MG per tablet Take 1 tablet by mouth daily.    SOCIAL HISTORY: He  reports that he has been smoking.  He has never used smokeless tobacco. He reports that he drinks about 4.2 - 8.4 oz of alcohol per week . He reports that he does not use drugs. Wife reports he quit smoking after stroke in Oct 2017.   REVIEW OF SYSTEMS:  Wife reports non-productive cough prior to intubation. No chest pain. Urinary difficulty.   SUBJECTIVE:  Patient sedated. Wife at bedside to provide history.   VITAL SIGNS: BP (!) 106/48   Pulse 90   Resp 20   Ht 5\' 9"  (1.753 m)   SpO2 100%   HEMODYNAMICS:    VENTILATOR SETTINGS: Vent Mode: PRVC FiO2 (%):  [60 %] 60 % Set Rate:  [16 bmp] 16 bmp Vt Set:  [570 mL-600 mL] 570 mL PEEP:  [5 cmH20] 5 cmH20 Plateau  Pressure:  [20 cmH20-28 cmH20] 28 cmH20  INTAKE / OUTPUT: No intake/output data recorded.  PHYSICAL EXAMINATION: General:  Male lying in bed  Neuro:  Sedated. Does not open eyes. Does not follow commands or respond to voice.  HEENT:  ETT in place without excessive secretions.  Cardiovascular:  RRR.  Lungs:  Coarse breath sounds bilaterally.  Abdomen:  Soft, non-distended  Musculoskeletal:  No purposeful movement of extremities.  Skin:  No rashes on exposed skin.   LABS:  BMET No results for input(s): NA, K, CL, CO2, BUN, CREATININE, GLUCOSE in the last 168 hours.  Electrolytes No results for input(s): CALCIUM, MG, PHOS in the last 168 hours.  CBC No results for input(s): WBC, HGB, HCT, PLT in the last 168 hours.  Coag's No results for input(s): APTT, INR in the last 168 hours.  Sepsis Markers No results for input(s): LATICACIDVEN, PROCALCITON, O2SATVEN in the last 168 hours.  ABG No results for input(s): PHART, PCO2ART, PO2ART in the last 168 hours.  Liver Enzymes No results for input(s): AST, ALT, ALKPHOS, BILITOT, ALBUMIN in the last 168 hours.  Cardiac Enzymes No results for input(s): TROPONINI, PROBNP in the last 168 hours.  Glucose No results for input(s): GLUCAP in the last 168 hours.  Imaging No results found.   STUDIES:    CULTURES:   ANTIBIOTICS: Vancomycin 11/12 >>  Cefepime 11/12 >>  Levaquin 11/11 >>   SIGNIFICANT EVENTS: 11/1: Admitted to [facility] for hypoxia and dyspnea, found to have multilobar PNA and pleural effusions  11/6: Thoracentesis performed; culture grew Citrobacter  11/13: CTA chest negative for PE  11/4: Patient intubated  11/17: Patient started on Levophed, transferred to [facility]    LINES/TUBES: ETT 11/14 >>  Right Femoral 11/17 >>  DISCUSSION: Duane Harper is 70 y.o. male with PMH of significant tobacco use, recent CVA, MS, HTN, CKD, and BPH who was transferred from [facility] for acute  respiratory failure requiring intubation in the setting of multilobar PNA and pleural effusions that cultured Citrobacter. Patient was also persistently hypotensive requiring pressor support.   ASSESSMENT / PLAN:  PULMONARY A: H/o tobacco abuse, likely COPD  Acute Hypoxic Respiratory Failure  Multilobar PNA  Pleural Effusions  P:   Vent Support  CXR  Brochodilators   CARDIOVASCULAR A:  H/o HTN  Hypotension  P:  Levophed with MAP goal >65  Troponins  Echo   RENAL A:   CKD (bl 1.3-1.5)  BPH P:   BMET  Resume BPH medications once foley d/c   GASTROINTESTINAL A:   Nil acute  P:   NPO  IV Pepcid  TFs in AM   HEMATOLOGIC A:   DVT prophylaxis  P:  CBC  Heparin SQ   INFECTIOUS A:   Citrobacter  Multilobar PNA   P:   Vancomycin and Meropenem  Pan-culture  Monitor fevers and WBC   ENDOCRINE A:   T2DM P:   Sensitive SSI  NEUROLOGIC A:   H/o MS  Recent CVA (10/17)  Sedated  P:   RASS goal: -1  Propofol and Fentanyl    FAMILY  - Updates: wife at bedside   - Inter-disciplinary family meet or Palliative Care meeting due by: 11/24    Pulmonary and Critical Care Medicine [facility] Pager: [phone]  03/06/2016, 8:02 PM

## 2016-03-09 NOTE — Progress Notes (Addendum)
RT informed RN of panic value on ABG. RT attempted to call Elink, and was told MD would call RT back when available

## 2016-03-10 ENCOUNTER — Inpatient Hospital Stay (HOSPITAL_COMMUNITY): Payer: PPO

## 2016-03-10 DIAGNOSIS — J9600 Acute respiratory failure, unspecified whether with hypoxia or hypercapnia: Secondary | ICD-10-CM

## 2016-03-10 DIAGNOSIS — Z452 Encounter for adjustment and management of vascular access device: Secondary | ICD-10-CM

## 2016-03-10 LAB — BLOOD GAS, ARTERIAL
ACID-BASE DEFICIT: 12.6 mmol/L — AB (ref 0.0–2.0)
ACID-BASE DEFICIT: 7.5 mmol/L — AB (ref 0.0–2.0)
Acid-base deficit: 10.6 mmol/L — ABNORMAL HIGH (ref 0.0–2.0)
BICARBONATE: 17.3 mmol/L — AB (ref 20.0–28.0)
Bicarbonate: 13.3 mmol/L — ABNORMAL LOW (ref 20.0–28.0)
Bicarbonate: 14.9 mmol/L — ABNORMAL LOW (ref 20.0–28.0)
DRAWN BY: 418751
DRAWN BY: 418751
Drawn by: 418751
FIO2: 50
FIO2: 50
FIO2: 50
MECHVT: 570 mL
MECHVT: 570 mL
O2 SAT: 94.1 %
O2 Saturation: 95.7 %
O2 Saturation: 96.5 %
PATIENT TEMPERATURE: 98.6
PCO2 ART: 31.6 mmHg — AB (ref 32.0–48.0)
PCO2 ART: 33.5 mmHg (ref 32.0–48.0)
PEEP/CPAP: 5 cmH2O
PEEP/CPAP: 5 cmH2O
PEEP: 5 cmH2O
PH ART: 7.246 — AB (ref 7.350–7.450)
PO2 ART: 74 mmHg — AB (ref 83.0–108.0)
PO2 ART: 82.7 mmHg — AB (ref 83.0–108.0)
Patient temperature: 98.6
Patient temperature: 98.6
RATE: 28 resp/min
RATE: 28 resp/min
RATE: 35 resp/min
VT: 570 mL
pCO2 arterial: 33.9 mmHg (ref 32.0–48.0)
pH, Arterial: 7.271 — ABNORMAL LOW (ref 7.350–7.450)
pH, Arterial: 7.328 — ABNORMAL LOW (ref 7.350–7.450)
pO2, Arterial: 89.1 mmHg (ref 83.0–108.0)

## 2016-03-10 LAB — HEPATIC FUNCTION PANEL
ALT: 20 U/L (ref 17–63)
AST: 27 U/L (ref 15–41)
Albumin: 1.5 g/dL — ABNORMAL LOW (ref 3.5–5.0)
Alkaline Phosphatase: 115 U/L (ref 38–126)
BILIRUBIN DIRECT: 0.2 mg/dL (ref 0.1–0.5)
BILIRUBIN INDIRECT: 0.6 mg/dL (ref 0.3–0.9)
Total Bilirubin: 0.8 mg/dL (ref 0.3–1.2)
Total Protein: 4.9 g/dL — ABNORMAL LOW (ref 6.5–8.1)

## 2016-03-10 LAB — BODY FLUID CELL COUNT WITH DIFFERENTIAL
LYMPHS FL: 24 %
LYMPHS FL: 8 %
MONOCYTE-MACROPHAGE-SEROUS FLUID: 11 % — AB (ref 50–90)
Monocyte-Macrophage-Serous Fluid: 25 % — ABNORMAL LOW (ref 50–90)
Neutrophil Count, Fluid: 51 % — ABNORMAL HIGH (ref 0–25)
Neutrophil Count, Fluid: 81 % — ABNORMAL HIGH (ref 0–25)
Total Nucleated Cell Count, Fluid: 1023 cu mm — ABNORMAL HIGH (ref 0–1000)
Total Nucleated Cell Count, Fluid: 758 cu mm (ref 0–1000)

## 2016-03-10 LAB — GLUCOSE, SEROUS FLUID
GLUCOSE FL: 135 mg/dL
GLUCOSE FL: 139 mg/dL

## 2016-03-10 LAB — POCT I-STAT 3, ART BLOOD GAS (G3+)
ACID-BASE DEFICIT: 12 mmol/L — AB (ref 0.0–2.0)
ACID-BASE DEFICIT: 10 mmol/L — AB (ref 0.0–2.0)
BICARBONATE: 14.9 mmol/L — AB (ref 20.0–28.0)
Bicarbonate: 13.9 mmol/L — ABNORMAL LOW (ref 20.0–28.0)
O2 SAT: 97 %
O2 Saturation: 95 %
PH ART: 7.279 — AB (ref 7.350–7.450)
PO2 ART: 85 mmHg (ref 83.0–108.0)
PO2 ART: 96 mmHg (ref 83.0–108.0)
TCO2: 15 mmol/L (ref 0–100)
TCO2: 16 mmol/L (ref 0–100)
pCO2 arterial: 29.7 mmHg — ABNORMAL LOW (ref 32.0–48.0)
pCO2 arterial: 30.9 mmHg — ABNORMAL LOW (ref 32.0–48.0)
pH, Arterial: 7.293 — ABNORMAL LOW (ref 7.350–7.450)

## 2016-03-10 LAB — GLUCOSE, CAPILLARY
GLUCOSE-CAPILLARY: 136 mg/dL — AB (ref 65–99)
GLUCOSE-CAPILLARY: 153 mg/dL — AB (ref 65–99)
Glucose-Capillary: 104 mg/dL — ABNORMAL HIGH (ref 65–99)
Glucose-Capillary: 119 mg/dL — ABNORMAL HIGH (ref 65–99)
Glucose-Capillary: 121 mg/dL — ABNORMAL HIGH (ref 65–99)
Glucose-Capillary: 162 mg/dL — ABNORMAL HIGH (ref 65–99)

## 2016-03-10 LAB — CBC
HEMATOCRIT: 24.5 % — AB (ref 39.0–52.0)
Hemoglobin: 7.8 g/dL — ABNORMAL LOW (ref 13.0–17.0)
MCH: 27 pg (ref 26.0–34.0)
MCHC: 31.8 g/dL (ref 30.0–36.0)
MCV: 84.8 fL (ref 78.0–100.0)
Platelets: 212 10*3/uL (ref 150–400)
RBC: 2.89 MIL/uL — AB (ref 4.22–5.81)
RDW: 14.6 % (ref 11.5–15.5)
WBC: 11.5 10*3/uL — AB (ref 4.0–10.5)

## 2016-03-10 LAB — BASIC METABOLIC PANEL
ANION GAP: 11 (ref 5–15)
BUN: 43 mg/dL — ABNORMAL HIGH (ref 6–20)
CO2: 16 mmol/L — AB (ref 22–32)
Calcium: 8.1 mg/dL — ABNORMAL LOW (ref 8.9–10.3)
Chloride: 108 mmol/L (ref 101–111)
Creatinine, Ser: 4.22 mg/dL — ABNORMAL HIGH (ref 0.61–1.24)
GFR calc Af Amer: 15 mL/min — ABNORMAL LOW (ref >60)
GFR calc non Af Amer: 13 mL/min — ABNORMAL LOW (ref >60)
GLUCOSE: 124 mg/dL — AB (ref 65–99)
POTASSIUM: 4.4 mmol/L (ref 3.5–5.1)
Sodium: 135 mmol/L (ref 135–145)

## 2016-03-10 LAB — MAGNESIUM: MAGNESIUM: 1.9 mg/dL (ref 1.7–2.4)

## 2016-03-10 LAB — TROPONIN I: Troponin I: <0.03 ng/mL (ref <0.03)

## 2016-03-10 LAB — TRIGLYCERIDES: Triglycerides: 171 mg/dL — ABNORMAL HIGH (ref <150)

## 2016-03-10 LAB — HEMOGLOBIN AND HEMATOCRIT, BLOOD
HEMATOCRIT: 26.2 % — AB (ref 39.0–52.0)
HEMOGLOBIN: 8.5 g/dL — AB (ref 13.0–17.0)

## 2016-03-10 LAB — LACTATE DEHYDROGENASE, PLEURAL OR PERITONEAL FLUID
LD FL: 253 U/L — AB (ref 3–23)
LD FL: 499 U/L — AB (ref 3–23)

## 2016-03-10 LAB — PROCALCITONIN: Procalcitonin: 2.24 ng/mL

## 2016-03-10 LAB — PHOSPHORUS: Phosphorus: 5.6 mg/dL — ABNORMAL HIGH (ref 2.5–4.6)

## 2016-03-10 LAB — ABO/RH: ABO/RH(D): O NEG

## 2016-03-10 LAB — STREP PNEUMONIAE URINARY ANTIGEN: Strep Pneumo Urinary Antigen: NEGATIVE

## 2016-03-10 LAB — PROTIME-INR
INR: 1.23
PROTHROMBIN TIME: 15.6 s — AB (ref 11.4–15.2)

## 2016-03-10 LAB — PREPARE RBC (CROSSMATCH)

## 2016-03-10 LAB — LACTIC ACID, PLASMA: LACTIC ACID, VENOUS: 1.1 mmol/L (ref 0.5–1.9)

## 2016-03-10 MED ORDER — ETOMIDATE 2 MG/ML IV SOLN
INTRAVENOUS | Status: AC
  Administered 2016-03-10: 20 mg
  Filled 2016-03-10: qty 20

## 2016-03-10 MED ORDER — LIDOCAINE HCL (PF) 1 % IJ SOLN
INTRAMUSCULAR | Status: AC
  Filled 2016-03-10: qty 5

## 2016-03-10 MED ORDER — SODIUM CHLORIDE 0.9 % IV SOLN
0.0300 [IU]/min | INTRAVENOUS | Status: DC
Start: 2016-03-10 — End: 2016-03-12
  Administered 2016-03-10: 0.03 [IU]/min via INTRAVENOUS
  Filled 2016-03-10: qty 2

## 2016-03-10 MED ORDER — VANCOMYCIN HCL 10 G IV SOLR
1500.0000 mg | Freq: Once | INTRAVENOUS | Status: AC
  Administered 2016-03-10: 1500 mg via INTRAVENOUS
  Filled 2016-03-10: qty 1500

## 2016-03-10 MED ORDER — SODIUM BICARBONATE 8.4 % IV SOLN
INTRAVENOUS | Status: DC
  Administered 2016-03-10 – 2016-03-12 (×2): via INTRAVENOUS
  Filled 2016-03-10 (×8): qty 150

## 2016-03-10 MED ORDER — VANCOMYCIN HCL IN DEXTROSE 1-5 GM/200ML-% IV SOLN
1000.0000 mg | INTRAVENOUS | Status: DC
  Administered 2016-03-11: 1000 mg via INTRAVENOUS
  Filled 2016-03-10: qty 200

## 2016-03-10 MED ORDER — MIDAZOLAM HCL 2 MG/2ML IJ SOLN
2.0000 mg | INTRAMUSCULAR | Status: DC | PRN
  Administered 2016-03-10 – 2016-03-12 (×5): 2 mg via INTRAVENOUS
  Filled 2016-03-10 (×6): qty 2

## 2016-03-10 NOTE — Progress Notes (Signed)
Pharmacy Antibiotic Note Vanetta MuldersCarroll W Akard is a 70 y.o. male admitted on 03/11/2016 from OSH with respiratory failure and multifocal  pneumonia.  Pharmacy has been consulted for meropenem and vancomycin dosing.  Plan: 1. Meropenem 500 mg IV every 12 hours 2. Vancomycin 1500 mg IV x 1 now followed by 1000 mg IV every 48 hours   Height: 5\' 9"  (175.3 cm) Weight: 183 lb 10.3 oz (83.3 kg) IBW/kg (Calculated) : 70.7  Temp (24hrs), Avg:99.3 F (37.4 C), Min:99.1 F (37.3 C), Max:99.5 F (37.5 C)   Recent Labs Lab 03/19/2016 2235  WBC 10.7*  CREATININE 3.66*    Estimated Creatinine Clearance: 19 mL/min (by C-G formula based on SCr of 3.66 mg/dL (H)).    No Known Allergies  Antimicrobials this admission:  11/17 meropenem >>  11/17 vancomycin >>   Dose adjustments this admission:  N/a  Microbiology results:  11/17 BCx:  11/17 UCx:   11/17 Sputum:   11/17 MRSA PCR: negative  11/17 strep pna antigen:  11/17 Legionella antigen:   Thank you for allowing pharmacy to be a part of this patient's care.  Pollyann SamplesAndy Veta Dambrosia, PharmD, BCPS 03/10/2016, 2:42 AM Pager: [phone]

## 2016-03-10 NOTE — Progress Notes (Addendum)
Initial Nutrition Assessment  DOCUMENTATION CODES:  Not applicable  INTERVENTION:  Recommend Restarting TF when medically able.   Reccomend TF via OGT with Vital af 1.2 at goal rate of 70 ml/h (1680 ml per day)to provide 2080 kcals, 126 gm protein, 1362 ml free water daily.  NUTRITION DIAGNOSIS:  Inadequate oral intake related to inability to eat as evidenced by NPO status.  GOAL:  Patient will meet greater than or equal to 90% of their needs  MONITOR:  Diet advancement, Vent status, Labs, I & O's  REASON FOR ASSESSMENT:  Ventilator    ASSESSMENT:  70 y/o PMHx HLD, DM, MS, CVA (01/31/16), HTN, CKD.  Pt had presented to OSH 11/1 with PNA and bilateral pleural effusions. Intubated 11/14. Transferred to [facility].   Spoke with wife at bedside. She reports that prior to this acute illness patient had lost "a lot of weight". Pt was a diabetic but did not follow a diabetic. He apparently ate a lot of candy bars and other high carb foods. He took a fish oil and Vit D supplement.   She says his last A1C was 5.7. Pt has had issue with constipation.   He took a stool softener chronically. He was receiving one of these at [place]  Apparently pt was receiving TF prior to transport to [facility].   She estimated UBW to be ~180 lbs prior to CVA. Per chart review, His weight appears to be stable  Abdomen non distended.   NFPE: WDL: no edema  Patient is currently intubated on ventilator support MV: 16.4 L/min Temp (24hrs), Avg:99.1 F (37.3 C), Min:98.4 F (36.9 C), Max:99.7 F (37.6 C)  Propofol: None- hung but not running  Labs: High Phos, Renal labs. Albumin: 1.5, CBGs 110-140, WBC: 11.5, H/H:8.5/26.2 Medications: Pepcid, IV abx, Levo, Fentanyl, Propofol has been stopped   Recent Labs Lab March 24, 2016 2235 03/10/16 0322  NA 136 135  K 4.2 4.4  CL 110 108  CO2 17* 16*  BUN 39* 43*  CREATININE 3.66* 4.22*  CALCIUM 7.6* 8.1*  MG 1.8 1.9  PHOS 5.6* 5.6*  GLUCOSE 95 124*     Diet Order:  Diet NPO time specified  Skin: Flaky, Echhymotic  Last BM:  Unknown  Height:  Ht Readings from Last 1 Encounters:  04/11/2016 5\' 9"  (1.753 m)   Weight:  Wt Readings from Last 1 Encounters:  03/10/16 181 lb 10.5 oz (82.4 kg)   Wt Readings from Last 10 Encounters:  03/10/16 181 lb 10.5 oz (82.4 kg)  05/16/15 179 lb 12.8 oz (81.6 kg)  11/10/14 192 lb 12.8 oz (87.5 kg)  09/16/14 192 lb (87.1 kg)  08/03/14 196 lb 9.6 oz (89.2 kg)  06/01/14 194 lb (88 kg)   Ideal Body Weight:  72.73 kg  BMI:  Body mass index is 26.83 kg/m.  Estimated Nutritional Needs:  Kcal:  2100 kcals Protein:  109-124 g (1.5-1.7 g/kg bw) Fluid:  Per MD  EDUCATION NEEDS:  No education needs identified at this time  [name] RD, LDN, CNSC Clinical Nutrition Pager: [phone] 03/10/2016 1:31 PM

## 2016-03-10 NOTE — Procedures (Signed)
Chest Tube Insertion Procedure Note  Indications:  Clinically significant Empyema  Pre-operative Diagnosis: Empyema  Post-operative Diagnosis: Empyema  Procedure Details  Informed consent was obtained for the procedure, including sedation.  Risks of lung perforation, hemorrhage, arrhythmia, and adverse drug reaction were discussed.   After sterile skin prep, using standard technique, a 28 French tube was placed in the right lateral 7th  rib space.  Findings: 700 ml of purulent fluid obtained  Estimated Blood Loss:  Minimal         Specimens:  Sent purulent fluid              Complications:  None; patient tolerated the procedure well.         Disposition: ICU - intubated and critically ill.         Condition: stable  Attending Attestation: I performed the procedure.  Floating materieal tyhroughout [place] into pocket   [employer]. Tyson Alias, MD, FACP Pgr: [phone] [place] [facility]

## 2016-03-10 NOTE — Procedures (Signed)
Central Venous Catheter Insertion Procedure Note Duane Harper [id number] June 03, 1945  Procedure: Insertion of Central Venous Catheter Indications: Assessment of intravascular volume, Drug and/or fluid administration and Frequent blood sampling  Procedure Details Consent: Risks of procedure as well as the alternatives and risks of each were explained to the (patient/caregiver).  Consent for procedure obtained. Time Out: Verified patient identification, verified procedure, site/side was marked, verified correct patient position, special equipment/implants available, medications/allergies/relevent history reviewed, required imaging and test results available.  Performed  Maximum sterile technique was used including antiseptics, cap, gloves, gown, hand hygiene, mask and sheet. Skin prep: Chlorhexidine; local anesthetic administered A antimicrobial bonded/coated triple lumen catheter was placed in the right subclavian vein using the Seldinger technique.  Evaluation Blood flow good Complications: Complications of wireplaced then resistance Patient did not tolerate procedure well. Chest X-ray ordered to verify placement.  CXR: pending.  Duane Harper 03/10/2016, 4:51 PM  Pulled wire Majority procedure completed  aboirted  Mcarthur Rossetti. Tyson Alias, MD, FACP Pgr: [phone] [place] [facility]

## 2016-03-10 NOTE — Progress Notes (Addendum)
PULMONARY / CRITICAL CARE MEDICINE   Name: Duane Harper MRN: [id number] DOB: [birth year]    ADMISSION DATE:  04/12/2016  [facility]ING MD:  [facility]   CHIEF COMPLAINT:  Respiratory Failure   HISTORY OF PRESENT ILLNESS:  History obtained from wife and records review as patient is sedated/intubated.   Duane Harper is 70 y.o. 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 Had left sided deficits from this CVA as well as cognitive changes. While undergoing rehab for his CVA, he developed dyspnea. Was diagnosed with pneumonia and bilateral pleural effusions. Patient was started on Levaquin and required supplemental O2. Patient's pleural fluid isolated Citrobacter. Fluid analysis suggested transudate process. Patient was treated with Lasix and antibiotics were broadened.  On 11/13, patient had a syncopal episode while participating in PT. He was found to be hypotensive at that time. Patient continued to remain febrile with worsening hypoxia despite supplemental O2 and abx coverage. CTA chest obtained which was negative for PE and slight progression of severe multilobar PNA compared to prior CT.   On 11/14, patient stated he desired intubation as his WOB was significant on BiPAP. Bronchoscopy was performed which showed no endobronchial lesions and minimal inflammation of the bronchi in the left upper lobe. Bronchial cultures showed normal oral respiratory flora.   Had discussion with wife regarding patient's wishes. She reports that he would not want to remain on ventilator for long term. He would not wish to have a tracheostomy or a PEG tube. His wish would be to be a DNR. He would not want chest compressions or defibrillation.   SIGNIFICANT EVENTS: 11/1: Admitted to [facility] for hypoxia and dyspnea, found to have multilobar PNA and pleural effusions  11/6: Thoracentesis performed; culture grew Citrobacter  11/13: CTA chest negative for PE  11/4: Patient  intubated  11/17: Patient started on Levophed, transferred to [facility]   SUBJECTIVE:  Sedated this morning. No acute events overnight.    VITAL SIGNS: BP (!) 121/49   Pulse 74   Temp 98.5 F (36.9 C) (Oral)   Resp (!) 28   Ht 5\' 9"  (1.753 m)   Wt 181 lb 10.5 oz (82.4 kg)   SpO2 97%   BMI 26.83 kg/m   HEMODYNAMICS:    VENTILATOR SETTINGS: Vent Mode: PRVC FiO2 (%):  [50 %-60 %] 50 % Set Rate:  [16 bmp-28 bmp] 28 bmp Vt Set:  [570 mL-600 mL] 570 mL PEEP:  [5 cmH20] 5 cmH20 Plateau Pressure:  [20 cmH20-28 cmH20] 22 cmH20  INTAKE / OUTPUT: I/O last 3 completed shifts: In: 151.5 [I.V.:1.5; Other:150] Out: -   PHYSICAL EXAMINATION: General:  Male lying in bed  Neuro:  Sedated. Does not open eyes. Does not follow commands or respond to voice.  HEENT:  ETT in place without excessive secretions. Dry lips.  Cardiovascular:  RRR.  Lungs:  Coarse breath sounds bilaterally.  Abdomen:  Soft, non-distended  Musculoskeletal:  No purposeful movement of extremities.  Skin:  No rashes on exposed skin.   LABS:  BMET  Recent Labs Lab 04/21/2016 2235 03/10/16 0322  NA 136 135  K 4.2 4.4  CL 110 108  CO2 17* 16*  BUN 39* 43*  CREATININE 3.66* 4.22*  GLUCOSE 95 124*    Electrolytes  Recent Labs Lab 04/22/2016 2235 03/10/16 0322  CALCIUM 7.6* 8.1*  MG 1.8 1.9  PHOS 5.6* 5.6*    CBC  Recent Labs Lab March 24, 2016 2235 03/10/16  0322  WBC 10.7* 11.5*  HGB 6.3* 7.8*  HCT 19.5* 24.5*  PLT 179 212    Coag's No results for input(s): APTT, INR in the last 168 hours.  Sepsis Markers No results for input(s): LATICACIDVEN, PROCALCITON, O2SATVEN in the last 168 hours.  ABG  Recent Labs Lab 03/03/2016 2046 03/10/16 0011 03/10/16 0445  PHART 7.164* 7.271* 7.246*  PCO2ART 42.4 33.5 31.6*  PO2ART 103.0 82.7* 74.0*    Liver Enzymes No results for input(s): AST, ALT, ALKPHOS, BILITOT, ALBUMIN in the last 168 hours.  Cardiac Enzymes  Recent Labs Lab  03/01/2016 2235 03/10/16 0322  TROPONINI <0.03 <0.03    Glucose  Recent Labs Lab 03/04/2016 2337 03/10/16 0343  GLUCAP 96 119*    Imaging Dg Chest Port 1 View  Result Date: 02/26/2016 CLINICAL DATA:  Acute onset of respiratory failure. Initial encounter. EXAM: PORTABLE CHEST 1 VIEW COMPARISON:  Chest radiograph performed earlier today at 7:12 a.m. FINDINGS: The patient's endotracheal tube is seen ending 4 cm above the carina. An enteric tube is noted extending below the diaphragm. Patchy bilateral airspace opacification is noted, with sparing at the right lung base. This is compatible with multifocal pneumonia, somewhat worsened from the prior study on the left side. Small bilateral pleural effusions are seen. No pneumothorax is seen. The cardiomediastinal silhouette is normal in size. No acute osseous abnormalities are identified. IMPRESSION: 1. Endotracheal tube seen ending 4 cm above the carina. 2. Patchy bilateral airspace opacification, with sparing at the right lung base. This is compatible with multifocal pneumonia, somewhat worsened on the left side from the prior study. Small bilateral pleural effusions seen. Electronically Signed   By: Roanna Raider M.D.   On: 02/24/2016 23:38     STUDIES:    CULTURES:   ANTIBIOTICS: Vancomycin 11/17 >>  Meropenem 11/17 >>    LINES/TUBES: ETT 11/14 >>  Right Femoral 11/17 >>  DISCUSSION: Duane Harper is 70 y.o. male with PMH of significant tobacco use, recent CVA, MS, HTN, CKD, and BPH who was transferred from [facility] for acute respiratory failure requiring intubation in the setting of multilobar PNA and pleural effusions that cultured Citrobacter. Patient was also persistently hypotensive requiring pressor support.   ASSESSMENT / PLAN:  PULMONARY A: H/o tobacco abuse, likely COPD  Acute Hypoxic Respiratory Failure  Multilobar PNA  Pleural Effusions  / empyema LEFT!! ALI P:   Vent Support  CXR  Brochodilators   abg noted, increase rate 35 Ct chest  Bicarb add  CARDIOVASCULAR A:  H/o HTN  Hypotension  P:  Levophed with MAP goal >65  Echo  cvp assessment cortisol  RENAL A:   AoCKD (bl 1.3-1.5)  Non-anion gap metabolic acidosis  BPH P:   BMET  Resume BPH medications once foley d/c  Consider NaBicarb   GASTROINTESTINAL A:   Nil acute  P:   NPO  IV Pepcid  TFs in AM  Get lft  HEMATOLOGIC A:   Acute anemia, s/p 1u PRBC with appropriate response  likley dilution P:  Monitor CBC  Transfuse HgB <7   INFECTIOUS A:   Multilobar PNA , thought 2/2 to Citrobacter  EMPYEMA P:   Vancomycin and Meropenem  Pan-culture  Monitor fevers and WBC  Consider chest tube  ENDOCRINE A:   T2DM P:   Sensitive SSI  cortisol  NEUROLOGIC A:   H/o MS  Recent CVA (10/17)  Sedated  P:   RASS goal: -2 Propofol and Fentanyl  WUA  FAMILY  - Updates: wife not here   - Inter-disciplinary family meet or Palliative Care meeting due by: 11/24    Marcy Sirenatherine Wallace, D.O. 03/10/2016, 6:05 AM PGY-2, [facility]   STAFF NOTE: I, Duane Percyaniel Jamilet Ambroise, MD FACP have personally reviewed patient's available data, including medical history, events of note, physical examination and test results as part of my evaluation. I have discussed with resident/NP and other care providers such as pharmacist, RN and RRT. In addition, I personally evaluated patient and elicited key findings of: ronchi, not fc, limited edema, case of MODS secondary to PNA and my understanding is citrobacter in effusion = empyema, no chest  Tube placed for over a week, CT chest noted with residual loculations, fevers prior, has ALI also, acidosis, stiff, DNR noted, will CT chest now and assess residual loculatoina dn rt side, will ask [facility] for sens of citrobacter and confirmation of effusion location for this organism, get cvp, maintain current ABX, also on levo 9 mics, need cvp neck line, I will place, add  bicarb for NONAG and ARDS ph less 7.25, repeat abg q6h, I will speak with wife to understand wishes for aggressive care, I feel we should be aggressive with possible reversible infections , especially empyema without drainage, get stat coags also for likely chest tube placement, add vaso, cortisl stress roids The patient is critically ill with multiple organ systems failure and requires high complexity decision making for assessment and support, frequent evaluation and titration of therapies, application of advanced monitoring technologies and extensive interpretation of multiple databases.   Critical Care Time devoted to patient care services described in this note is 35 Minutes. This time reflects time of care of this signee: Duane Percyaniel Jamaurion Slemmer, MD FACP. This critical care time does not reflect procedure time, or teaching time or supervisory time of PA/NP/Med student/Med Resident etc but could involve care discussion time. Rest per NP/medical resident whose note is outlined above and that I agree with   Duane Rossettianiel J. Tyson AliasFeinstein, MD, FACP Pgr: [phone] [place] [facility] 03/10/2016 8:16 AM

## 2016-03-10 NOTE — Procedures (Addendum)
Chest Tube Insertion Procedure Note LEFT  Indications:  Clinically significant Empyema  Pre-operative Diagnosis: Empyema  Post-operative Diagnosis: Empyema  Procedure Details  Informed consent was obtained for the procedure, including sedation.  Risks of lung perforation, hemorrhage, arrhythmia, and adverse drug reaction were discussed.   After sterile skin prep, using standard technique, a 32 French tube was placed in the left lateral 7 rib space.  Findings: 600 ml of serous fluid obtained  Estimated Blood Loss:  Minimal         Specimens:  Sent serosanguinous fluid              Complications:  None; patient tolerated the procedure well.         Disposition: ICU - intubated and critically ill.         Condition: stable  Attending Attestation: I performed the procedure.  I used [place] to guide into pocket  Placed 13 cm  Mcarthur Rossetti. Tyson Alias, MD, FACP Pgr: [phone] [place] [facility]

## 2016-03-10 NOTE — Procedures (Signed)
Central Venous Catheter Insertion Procedure Note EYOAB LADUCA [id number] March 21, 1946  Procedure: Insertion of Central Venous Catheter Indications: Assessment of intravascular volume, Drug and/or fluid administration and Frequent blood sampling  Procedure Details Consent: Risks of procedure as well as the alternatives and risks of each were explained to the (patient/caregiver).  Consent for procedure obtained. Time Out: Verified patient identification, verified procedure, site/side was marked, verified correct patient position, special equipment/implants available, medications/allergies/relevent history reviewed, required imaging and test results available.  Performed  Maximum sterile technique was used including antiseptics, cap, gloves, gown, hand hygiene, mask and sheet. Skin prep: Chlorhexidine; local anesthetic administered A antimicrobial bonded/coated triple lumen catheter was placed in the left internal jugular vein using the Seldinger technique.  Evaluation Blood flow good Complications: No apparent complications Patient did tolerate procedure well. Chest X-ray ordered to verify placement.  CXR: pending.  Nelda Bucks 03/10/2016, 6:04 PM  [place] To dc fem Get cvp  Mcarthur Rossetti. Tyson Alias, MD, FACP Pgr: [phone] [place] [facility]

## 2016-03-11 ENCOUNTER — Inpatient Hospital Stay (HOSPITAL_COMMUNITY): Payer: PPO

## 2016-03-11 DIAGNOSIS — Z4682 Encounter for fitting and adjustment of non-vascular catheter: Secondary | ICD-10-CM

## 2016-03-11 LAB — CBC WITH DIFFERENTIAL/PLATELET
BASOS PCT: 0 %
Basophils Absolute: 0 10*3/uL (ref 0.0–0.1)
EOS ABS: 0.4 10*3/uL (ref 0.0–0.7)
Eosinophils Relative: 4 %
HCT: 25.9 % — ABNORMAL LOW (ref 39.0–52.0)
HEMOGLOBIN: 8.7 g/dL — AB (ref 13.0–17.0)
Lymphocytes Relative: 12 %
Lymphs Abs: 1.2 10*3/uL (ref 0.7–4.0)
MCH: 27.7 pg (ref 26.0–34.0)
MCHC: 33.6 g/dL (ref 30.0–36.0)
MCV: 82.5 fL (ref 78.0–100.0)
MONOS PCT: 13 %
Monocytes Absolute: 1.3 10*3/uL — ABNORMAL HIGH (ref 0.1–1.0)
NEUTROS PCT: 71 %
Neutro Abs: 7.3 10*3/uL (ref 1.7–7.7)
Platelets: 203 10*3/uL (ref 150–400)
RBC: 3.14 MIL/uL — AB (ref 4.22–5.81)
RDW: 15.1 % (ref 11.5–15.5)
WBC: 10.3 10*3/uL (ref 4.0–10.5)

## 2016-03-11 LAB — BLOOD GAS, ARTERIAL
ACID-BASE DEFICIT: 4.3 mmol/L — AB (ref 0.0–2.0)
BICARBONATE: 20.7 mmol/L (ref 20.0–28.0)
Drawn by: 301361
FIO2: 50
LHR: 35 {breaths}/min
O2 Saturation: 96.3 %
PATIENT TEMPERATURE: 99.6
PCO2 ART: 42 mmHg (ref 32.0–48.0)
PEEP/CPAP: 5 cmH2O
PO2 ART: 91.3 mmHg (ref 83.0–108.0)
VT: 500 mL
pH, Arterial: 7.318 — ABNORMAL LOW (ref 7.350–7.450)

## 2016-03-11 LAB — RENAL FUNCTION PANEL
ALBUMIN: 1.3 g/dL — AB (ref 3.5–5.0)
ANION GAP: 8 (ref 5–15)
BUN: 51 mg/dL — ABNORMAL HIGH (ref 6–20)
CALCIUM: 8 mg/dL — AB (ref 8.9–10.3)
CO2: 21 mmol/L — ABNORMAL LOW (ref 22–32)
Chloride: 105 mmol/L (ref 101–111)
Creatinine, Ser: 5.62 mg/dL — ABNORMAL HIGH (ref 0.61–1.24)
GFR calc non Af Amer: 9 mL/min — ABNORMAL LOW (ref >60)
GFR, EST AFRICAN AMERICAN: 11 mL/min — AB (ref >60)
Glucose, Bld: 109 mg/dL — ABNORMAL HIGH (ref 65–99)
PHOSPHORUS: 6.5 mg/dL — AB (ref 2.5–4.6)
Potassium: 4.1 mmol/L (ref 3.5–5.1)
SODIUM: 134 mmol/L — AB (ref 135–145)

## 2016-03-11 LAB — BASIC METABOLIC PANEL
Anion gap: 10 (ref 5–15)
Anion gap: 11 (ref 5–15)
BUN: 52 mg/dL — AB (ref 6–20)
BUN: 56 mg/dL — AB (ref 6–20)
CALCIUM: 7.9 mg/dL — AB (ref 8.9–10.3)
CALCIUM: 7.8 mg/dL — AB (ref 8.9–10.3)
CHLORIDE: 103 mmol/L (ref 101–111)
CO2: 20 mmol/L — AB (ref 22–32)
CO2: 20 mmol/L — AB (ref 22–32)
CREATININE: 5.94 mg/dL — AB (ref 0.61–1.24)
CREATININE: 6.33 mg/dL — AB (ref 0.61–1.24)
Chloride: 105 mmol/L (ref 101–111)
GFR calc non Af Amer: 9 mL/min — ABNORMAL LOW (ref >60)
GFR calc non Af Amer: 8 mL/min — ABNORMAL LOW (ref >60)
GFR, EST AFRICAN AMERICAN: 10 mL/min — AB (ref >60)
GFR, EST AFRICAN AMERICAN: 9 mL/min — AB (ref >60)
GLUCOSE: 138 mg/dL — AB (ref 65–99)
Glucose, Bld: 112 mg/dL — ABNORMAL HIGH (ref 65–99)
Potassium: 4.2 mmol/L (ref 3.5–5.1)
Potassium: 4.4 mmol/L (ref 3.5–5.1)
Sodium: 135 mmol/L (ref 135–145)
Sodium: 134 mmol/L — ABNORMAL LOW (ref 135–145)

## 2016-03-11 LAB — GLUCOSE, CAPILLARY
GLUCOSE-CAPILLARY: 98 mg/dL (ref 65–99)
GLUCOSE-CAPILLARY: 136 mg/dL — AB (ref 65–99)
GLUCOSE-CAPILLARY: 144 mg/dL — AB (ref 65–99)
Glucose-Capillary: 96 mg/dL (ref 65–99)
Glucose-Capillary: 127 mg/dL — ABNORMAL HIGH (ref 65–99)

## 2016-03-11 LAB — URINE CULTURE: Culture: NO GROWTH

## 2016-03-11 LAB — POCT I-STAT 3, ART BLOOD GAS (G3+)
Acid-base deficit: 5 mmol/L — ABNORMAL HIGH (ref 0.0–2.0)
BICARBONATE: 19.1 mmol/L — AB (ref 20.0–28.0)
O2 Saturation: 97 %
PCO2 ART: 32.4 mmHg (ref 32.0–48.0)
PH ART: 7.381 (ref 7.350–7.450)
PO2 ART: 92 mmHg (ref 83.0–108.0)
Patient temperature: 99.6
TCO2: 20 mmol/L (ref 0–100)

## 2016-03-11 LAB — TYPE AND SCREEN
ABO/RH(D): O NEG
ANTIBODY SCREEN: NEGATIVE
Unit division: 0

## 2016-03-11 LAB — PHOSPHORUS
Phosphorus: 6.2 mg/dL — ABNORMAL HIGH (ref 2.5–4.6)
Phosphorus: 7 mg/dL — ABNORMAL HIGH (ref 2.5–4.6)

## 2016-03-11 LAB — MAGNESIUM
Magnesium: 1.8 mg/dL (ref 1.7–2.4)
Magnesium: 1.9 mg/dL (ref 1.7–2.4)
Magnesium: 2 mg/dL (ref 1.7–2.4)

## 2016-03-11 MED ORDER — HEPARIN SODIUM (PORCINE) 5000 UNIT/ML IJ SOLN
5000.0000 [IU] | Freq: Three times a day (TID) | INTRAMUSCULAR | Status: DC
  Administered 2016-03-11 – 2016-03-19 (×26): 5000 [IU] via SUBCUTANEOUS
  Filled 2016-03-11 (×27): qty 1

## 2016-03-11 MED ORDER — PRO-STAT SUGAR FREE PO LIQD
30.0000 mL | Freq: Two times a day (BID) | ORAL | Status: DC
  Filled 2016-03-11: qty 30

## 2016-03-11 MED ORDER — SODIUM CHLORIDE 0.9 % IV BOLUS (SEPSIS)
500.0000 mL | Freq: Once | INTRAVENOUS | Status: AC
  Administered 2016-03-11: 500 mL via INTRAVENOUS

## 2016-03-11 MED ORDER — VITAL AF 1.2 CAL PO LIQD
1000.0000 mL | ORAL | Status: DC
  Administered 2016-03-11 – 2016-03-12 (×2): 1000 mL

## 2016-03-11 MED ORDER — FAMOTIDINE 40 MG/5ML PO SUSR
20.0000 mg | Freq: Every day | ORAL | Status: DC
  Administered 2016-03-11 – 2016-03-18 (×8): 20 mg
  Filled 2016-03-11 (×11): qty 2.5

## 2016-03-11 MED ORDER — ALTEPLASE 2 MG IJ SOLR
2.0000 mg | Freq: Once | INTRAMUSCULAR | Status: AC
Start: 2016-03-11 — End: 2016-03-11
  Administered 2016-03-11: 2 mg
  Filled 2016-03-11: qty 2

## 2016-03-11 MED ORDER — VITAL HIGH PROTEIN PO LIQD: 1000.0000 mL | ORAL | Status: DC

## 2016-03-11 NOTE — Progress Notes (Signed)
eLink Physician-Brief Progress Note Patient Name: Duane Harper W Muehl DOB: Feb 06, 1946 MRN: [id number]   Date of Service  03/11/2016  HPI/Events of Note  Oliguria - CVP = 7 and Creatinine = 6.33 (renal failure). Not hyperkalemic, severely acidemic, volume overloaded or uremic, therefore, no indications for acute hemodialysis.   eICU Interventions  Will order: 1. 0.9 NaCl 500 mL IV over 30 minutes now. Plan if CVP >= 10, will give Lasix 100 mg IV X 1.      Intervention Category Intermediate Interventions: Oliguria - evaluation and management  Krissy Orebaugh Eugene 03/11/2016, 6:57 PM

## 2016-03-11 NOTE — Progress Notes (Addendum)
PULMONARY / CRITICAL CARE MEDICINE   Name: Duane Harper MRN: [id number] DOB: 1946/04/11    ADMISSION DATE:  03/08/2016  REFERRING MD:  [facility]   CHIEF COMPLAINT:  Respiratory Failure   HISTORY OF PRESENT ILLNESS:  History obtained from wife and records review as patient is sedated/intubated.   Duane Harper is 70 y.o. 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 Had left sided deficits from this CVA as well as cognitive changes. While undergoing rehab for his CVA, he developed dyspnea. Was diagnosed with pneumonia and bilateral pleural effusions. Patient was started on Levaquin and required supplemental O2. Patient's pleural fluid isolated Citrobacter. Fluid analysis suggested transudate process. Patient was treated with Lasix and antibiotics were broadened.  On 11/13, patient had a syncopal episode while participating in PT. He was found to be hypotensive at that time. Patient continued to remain febrile with worsening hypoxia despite supplemental O2 and abx coverage. CTA chest obtained which was negative for PE and slight progression of severe multilobar PNA compared to prior CT.   On 11/14, patient stated he desired intubation as his WOB was significant on BiPAP. Bronchoscopy was performed which showed no endobronchial lesions and minimal inflammation of the bronchi in the left upper lobe. Bronchial cultures showed normal oral respiratory flora.   Had discussion with wife regarding patient's wishes. She reports that he would not want to remain on ventilator for long term. He would not wish to have a tracheostomy or a PEG tube. His wish would be to be a DNR. He would not want chest compressions or defibrillation.   SIGNIFICANT EVENTS: 11/1: Admitted to [facility] for hypoxia and dyspnea, found to have multilobar PNA and pleural effusions  11/6: Thoracentesis performed; culture grew Citrobacter  11/13: CTA chest negative for PE  11/4: Patient  intubated  11/17: Patient started on Levophed, transferred to [facility]  11/18- chest tubes placed, came off pressors, line neck placed, fem dc'ed  SUBJECTIVE:  Chest tubes placed, came off pressors  VITAL SIGNS: BP (!) 92/47   Pulse 87   Temp 99.6 F (37.6 C) (Oral)   Resp (!) 35   Ht 5\' 9"  (1.753 m)   Wt 86 kg (189 lb 9.5 oz)   SpO2 100%   BMI 28.00 kg/m   HEMODYNAMICS:    VENTILATOR SETTINGS: Vent Mode: PRVC FiO2 (%):  [50 %] 50 % Set Rate:  [35 bmp] 35 bmp Vt Set:  [570 mL-573 mL] 570 mL PEEP:  [5 cmH20] 5 cmH20 Plateau Pressure:  [24 cmH20-26 cmH20] 24 cmH20  INTAKE / OUTPUT: I/O last 3 completed shifts: In: 4677.6 [I.V.:3342.6; Blood:335; Other:300; IV Piggyback:700] Out: 1280 [Urine:130; Emesis/NG output:250; Chest Tube:900]  PHYSICAL EXAMINATION: General: calm , rass -3 Neuro:  Sedated. rass -3 HEENT:  ETT in place, line clean Cardiovascular:  RRR s1 s2  Lungs:  Coarse breath sounds bilaterally, clear now bases  Abdomen:  Soft, non-distended , no r/g Musculoskeletal:  Mild edema Skin:  No rashes on exposed skin.   LABS:  BMET  Recent Labs Lab 03/11/2016 2235 03/10/16 0322 03/11/16 0540  NA 136 135 134*  K 4.2 4.4 4.1  CL 110 108 105  CO2 17* 16* 21*  BUN 39* 43* 51*  CREATININE 3.66* 4.22* 5.62*  GLUCOSE 95 124* 109*    Electrolytes  Recent Labs Lab 03/01/2016 2235 03/10/16 0322 03/11/16 0540  CALCIUM 7.6* 8.1* 8.0*  MG 1.8 1.9 1.8  PHOS  5.6* 5.6* 6.5*    CBC  Recent Labs Lab 11-Mar-2016 2235 03/10/16 0322 03/10/16 0936 03/11/16 0540  WBC 10.7* 11.5*  --  10.3  HGB 6.3* 7.8* 8.5* 8.7*  HCT 19.5* 24.5* 26.2* 25.9*  PLT 179 212  --  203    Coag's  Recent Labs Lab 03/10/16 0823  INR 1.23    Sepsis Markers  Recent Labs Lab 03-11-2016 2235 03/10/16 0823  LATICACIDVEN  --  1.1  PROCALCITON 2.24  --     ABG  Recent Labs Lab 03/10/16 0904 03/10/16 1327 03/10/16 2022  PHART 7.279* 7.293* 7.328*  PCO2ART 29.7*  30.9* 33.9  PO2ART 85.0 96.0 89.1    Liver Enzymes  Recent Labs Lab 03/10/16 0936 03/11/16 0540  AST 27  --   ALT 20  --   ALKPHOS 115  --   BILITOT 0.8  --   ALBUMIN 1.5* 1.3*    Cardiac Enzymes  Recent Labs Lab 03-11-16 2235 03/10/16 0322 03/10/16 0936  TROPONINI <0.03 <0.03 <0.03    Glucose  Recent Labs Lab 03/10/16 1226 03/10/16 1717 03/10/16 2020 03/10/16 2350 03/11/16 0359 03/11/16 0802  GLUCAP 136* 162* 153* 104* 96 98    Imaging Ct Chest Wo Contrast  Result Date: 03/10/2016 CLINICAL DATA:  Acute respiratory failure EXAM: CT CHEST WITHOUT CONTRAST TECHNIQUE: Multidetector CT imaging of the chest was performed following the standard protocol without IV contrast. COMPARISON:  03/06/2016 FINDINGS: Cardiovascular: Atherosclerotic calcifications in the aorta and great vessels are stable. Coronary artery calcifications are present involving all 3 coronary arteries. No evidence of ascending aortic aneurysm or intramural hematoma. Prominent aortic valvular calcifications are present. Mediastinum/Nodes: Abnormal mediastinal adenopathy is unchanged. 1.9 cm paratracheal node on image 42 is stable. Other prominent mediastinal nodes are present. No evidence of pericardial effusion. No evidence of pneumomediastinum. Tracheostomy tube is stable. NG tube tip is in the fundus of the stomach. Thyroid is unremarkable. There is stranding within the fat of the mediastinum. This does extend around the subclavian and axillary vessels. Lungs/Pleura: Heterogeneous opacities throughout both lungs are not significantly changed. Is characterized by areas of interstitial lung disease, ground-glass, and confluent consolidation with dependent air bronchograms. No obvious endobronchial airway lesion can be identified. Airways are grossly patent. Moderate bilateral pleural effusions have increased in size. Upper Abdomen: The gallbladder is distended. There is abnormal adenopathy in the  gastrohepatic ligament and periportal regions. A small amount of free fluid is present anterior to the liver. Musculoskeletal: No vertebral compression deformity. IMPRESSION: Extensive bilateral pulmonary opacities worrisome for an inflammatory process, ARDS, or edema are stable. Malignancy is not excluded. Moderate bilateral pleural effusions have increased. Small amount of free fluid is seen anterior to the liver. Mediastinal and upper abdominal adenopathy is nonspecific. Electronically Signed   By: Jolaine Click M.D.   On: 03/10/2016 11:14   Dg Chest Port 1 View  Result Date: 03/10/2016 CLINICAL DATA:  Central line placement.  Initial encounter. EXAM: PORTABLE CHEST 1 VIEW COMPARISON:  Chest radiograph performed earlier today at 4:53 p.m. FINDINGS: The patient's endotracheal tube is seen ending 5-6 cm above the carina. A left IJ line is noted ending about the proximal to mid SVC. Bibasilar chest tubes are noted. No pneumothorax is seen. Bilateral upper lobe and bibasilar airspace opacities remain concerning for multifocal pneumonia, perhaps slightly worsened from the prior study. Small bilateral pleural effusions are suspected. No pneumothorax is seen. The cardiomediastinal silhouette is mildly enlarged. No acute osseous abnormalities are identified. Soft tissue air  is noted along the left chest wall. IMPRESSION: 1. Endotracheal tube seen ending 5-6 cm above the carina. 2. Left IJ line noted ending about the proximal to mid SVC. 3. Bilateral upper lobe and bibasilar airspace opacities remain concerning for multifocal pneumonia, perhaps slightly worsened from the prior study. Suspect small bilateral pleural effusions. 4. Mild cardiomegaly. Electronically Signed   By: Roanna RaiderJeffery  Chang M.D.   On: 03/10/2016 18:39   Dg Chest Port 1 View  Result Date: 03/10/2016 CLINICAL DATA:  Bilateral chest tube placement EXAM: PORTABLE CHEST 1 VIEW COMPARISON:  CT chest 03/10/2016 FINDINGS: Bilateral basilar chest tubes. No  pneumothorax. Bilateral upper lobe airspace disease. Left lower lobe airspace disease. Stable cardiomediastinal silhouette. No acute osseous abnormality. Endotracheal tube with the tip 4 cm above the carina. Nasogastric tube coursing below the diaphragm. IMPRESSION: 1. New endotracheal tube with the tip 4 cm above the carina. 2. Bibasilar chest tubes without a pneumothorax. 3. Bilateral upper lobe and left lower lobe airspace disease concerning for multilobar pneumonia. Electronically Signed   By: Elige KoHetal  Patel   On: 03/10/2016 17:20    STUDIES:  CT chest 11/18>>>Extensive bilateral pulmonary opacities worrisome for an inflammatory process, ARDS, or edema are stable. Malignancy is not excluded.Moderate bilateral pleural effusions have increased.  CULTURES: 11/18 rt effusion>>> 11/18 left effusion>>> 11/17 BC>>> 11/17 sputum>>>  ANTIBIOTICS: Vancomycin 11/17 >>  Meropenem 11/17 >>   LINES/TUBES: ETT 11/14 >>  Right Femoral 11/17 >>11/18 Left IJ 11/18>>> Rt chest tuibe 11/18>>> Left chest tuber 11/18>>>  DISCUSSION: Vanetta MuldersCarroll W Argote is 70 y.o. male with PMH of significant tobacco use, recent CVA, MS, HTN, CKD, and BPH who [facility] respiratory failure requiring intubation in the setting of multilobar PNA and pleural effusions that cultured Citrobacter. Patient was also persistently hypotensive requiring pressor support.   ASSESSMENT / PLAN:  PULMONARY A: H/o tobacco abuse, likely COPD  Acute Hypoxic Respiratory Failure  Multilobar PNA  Pleural Effusions  / empyema LEFT R/o empyema rt ( likely) ALI P:   Obtain abg on rate 35 Chest tubes to water seal, pcxr 4 hours after ( no leaks, no ptx) pcxr in am Possible sbt today pending abg results  Drop TV for ards  CARDIOVASCULAR A:  H/o HTN  Hypotension  P:  Levophed with MAP goal >65 -off after chest tubes placed Echo pending cvp assessment, awaited as brown port not working, this has  higher incidence clabsi likely, will tpa---then cvp  RENAL A:   AoCKD (bl 1.3-1.5)  Non-anion gap metabolic acidosis imrpoved BPH P:   Dc bicarb if ph greater 7.25 Avoid saline with NONAG cvp needed, may need lasix challenge today bmet q8h Doubt wife wants HD [facility], will discuss  GASTROINTESTINAL A:   NPO P:   IV Pepcid  TFs today  HEMATOLOGIC A:   Acute anemia, s/p 1u PRBC with appropriate response  likley dilution anemia P:  Monitor CBC  Transfuse HgB <7  Given clotting up upper vessels, will doppler legs  INFECTIOUS A:   Multilobar PNA , thought 2/2 to Citrobacter  EMPYEMA, likely bilateral ( low yield possible after [facility]) P:   Vancomycin and Meropenem  chest tube drainage Wife does not want heroics, VTS etc  ENDOCRINE A:   T2DM, controlled P:   Sensitive SSI   NEUROLOGIC A:   H/o MS  Recent CVA (10/17)  Sedated  P:   RASS goal: 0 Propofol and Fentanyl  WUA   FAMILY  -  Updates: wife updated by me  - Inter-disciplinary family meet or Palliative Care meeting due by: 11/24  Ccm time 30 min   Mcarthur Rossetti. Tyson Alias, MD, FACP Pgr: [phone] [place] [facility] 03/11/2016 8:06 AM

## 2016-03-11 NOTE — Progress Notes (Signed)
Brief Nutrition Note  Consult received for enteral/tube feeding initiation and management.  Adult Enteral Nutrition Protocol already initiated. Switched TF order to reflect recommendations provided on 11/18. RD to follow-up 11/19.  Admitting Dx: Multifocal pneumonia [J18.9]  Body mass index is 28 kg/m. Pt meets criteria for overweight based on current BMI.  Labs:   Recent Labs Lab Mar 12, 2016 2235 03/10/16 0322 03/11/16 0540  NA 136 135 134*  K 4.2 4.4 4.1  CL 110 108 105  CO2 17* 16* 21*  BUN 39* 43* 51*  CREATININE 3.66* 4.22* 5.62*  CALCIUM 7.6* 8.1* 8.0*  MG 1.8 1.9 1.8  PHOS 5.6* 5.6* 6.5*  GLUCOSE 95 124* 109*    Tilda Franco, MS, RD, LDN Pager: [phone] After Hours Pager: [phone]

## 2016-03-12 ENCOUNTER — Inpatient Hospital Stay (HOSPITAL_COMMUNITY): Payer: PPO

## 2016-03-12 DIAGNOSIS — J869 Pyothorax without fistula: Secondary | ICD-10-CM

## 2016-03-12 DIAGNOSIS — R011 Cardiac murmur, unspecified: Secondary | ICD-10-CM

## 2016-03-12 DIAGNOSIS — R609 Edema, unspecified: Secondary | ICD-10-CM

## 2016-03-12 LAB — GLUCOSE, CAPILLARY
GLUCOSE-CAPILLARY: 175 mg/dL — AB (ref 65–99)
GLUCOSE-CAPILLARY: 160 mg/dL — AB (ref 65–99)
GLUCOSE-CAPILLARY: 124 mg/dL — AB (ref 65–99)
GLUCOSE-CAPILLARY: 114 mg/dL — AB (ref 65–99)
Glucose-Capillary: 104 mg/dL — ABNORMAL HIGH (ref 65–99)
Glucose-Capillary: 148 mg/dL — ABNORMAL HIGH (ref 65–99)
Glucose-Capillary: 155 mg/dL — ABNORMAL HIGH (ref 65–99)

## 2016-03-12 LAB — COMPREHENSIVE METABOLIC PANEL
ALBUMIN: 1.3 g/dL — AB (ref 3.5–5.0)
ALT: 22 U/L (ref 17–63)
ANION GAP: 9 (ref 5–15)
AST: 32 U/L (ref 15–41)
Alkaline Phosphatase: 153 U/L — ABNORMAL HIGH (ref 38–126)
BILIRUBIN TOTAL: 0.7 mg/dL (ref 0.3–1.2)
BUN: 57 mg/dL — ABNORMAL HIGH (ref 6–20)
CO2: 23 mmol/L (ref 22–32)
Calcium: 7.9 mg/dL — ABNORMAL LOW (ref 8.9–10.3)
Chloride: 102 mmol/L (ref 101–111)
Creatinine, Ser: 6.58 mg/dL — ABNORMAL HIGH (ref 0.61–1.24)
GFR, EST AFRICAN AMERICAN: 9 mL/min — AB (ref >60)
GFR, EST NON AFRICAN AMERICAN: 8 mL/min — AB (ref >60)
Glucose, Bld: 144 mg/dL — ABNORMAL HIGH (ref 65–99)
POTASSIUM: 4.2 mmol/L (ref 3.5–5.1)
Sodium: 134 mmol/L — ABNORMAL LOW (ref 135–145)
TOTAL PROTEIN: 4.8 g/dL — AB (ref 6.5–8.1)

## 2016-03-12 LAB — CULTURE, RESPIRATORY W GRAM STAIN: Culture: NORMAL

## 2016-03-12 LAB — BASIC METABOLIC PANEL
Anion gap: 12 (ref 5–15)
Anion gap: 10 (ref 5–15)
BUN: 62 mg/dL — ABNORMAL HIGH (ref 6–20)
BUN: 66 mg/dL — AB (ref 6–20)
CHLORIDE: 98 mmol/L — AB (ref 101–111)
CHLORIDE: 102 mmol/L (ref 101–111)
CO2: 22 mmol/L (ref 22–32)
CO2: 23 mmol/L (ref 22–32)
CREATININE: 7.07 mg/dL — AB (ref 0.61–1.24)
CREATININE: 7.35 mg/dL — AB (ref 0.61–1.24)
Calcium: 7.6 mg/dL — ABNORMAL LOW (ref 8.9–10.3)
Calcium: 7.8 mg/dL — ABNORMAL LOW (ref 8.9–10.3)
GFR, EST AFRICAN AMERICAN: 8 mL/min — AB (ref >60)
GFR, EST AFRICAN AMERICAN: 8 mL/min — AB (ref >60)
GFR, EST NON AFRICAN AMERICAN: 7 mL/min — AB (ref >60)
GFR, EST NON AFRICAN AMERICAN: 7 mL/min — AB (ref >60)
Glucose, Bld: 185 mg/dL — ABNORMAL HIGH (ref 65–99)
Glucose, Bld: 109 mg/dL — ABNORMAL HIGH (ref 65–99)
POTASSIUM: 4.3 mmol/L (ref 3.5–5.1)
Potassium: 4.2 mmol/L (ref 3.5–5.1)
SODIUM: 132 mmol/L — AB (ref 135–145)
SODIUM: 135 mmol/L (ref 135–145)

## 2016-03-12 LAB — MAGNESIUM
MAGNESIUM: 2.1 mg/dL (ref 1.7–2.4)
Magnesium: 2 mg/dL (ref 1.7–2.4)

## 2016-03-12 LAB — CBC WITH DIFFERENTIAL/PLATELET
BASOS ABS: 0 10*3/uL (ref 0.0–0.1)
BASOS PCT: 0 %
Eosinophils Absolute: 0.4 10*3/uL (ref 0.0–0.7)
Eosinophils Relative: 4 %
HEMATOCRIT: 25.4 % — AB (ref 39.0–52.0)
Hemoglobin: 8.4 g/dL — ABNORMAL LOW (ref 13.0–17.0)
LYMPHS PCT: 17 %
Lymphs Abs: 1.6 10*3/uL (ref 0.7–4.0)
MCH: 27.2 pg (ref 26.0–34.0)
MCHC: 33.1 g/dL (ref 30.0–36.0)
MCV: 82.2 fL (ref 78.0–100.0)
Monocytes Absolute: 1.1 10*3/uL — ABNORMAL HIGH (ref 0.1–1.0)
Monocytes Relative: 12 %
NEUTROS ABS: 6.1 10*3/uL (ref 1.7–7.7)
NEUTROS PCT: 67 %
Platelets: 198 10*3/uL (ref 150–400)
RBC: 3.09 MIL/uL — AB (ref 4.22–5.81)
RDW: 15 % (ref 11.5–15.5)
WBC: 9.2 10*3/uL (ref 4.0–10.5)

## 2016-03-12 LAB — LEGIONELLA PNEUMOPHILA SEROGP 1 UR AG: L. pneumophila Serogp 1 Ur Ag: NEGATIVE

## 2016-03-12 LAB — PATHOLOGIST SMEAR REVIEW

## 2016-03-12 LAB — ECHOCARDIOGRAM COMPLETE
HEIGHTINCHES: 69 in
Weight: 3146.41 oz

## 2016-03-12 LAB — PHOSPHORUS
PHOSPHORUS: 7.3 mg/dL — AB (ref 2.5–4.6)
PHOSPHORUS: 6.7 mg/dL — AB (ref 2.5–4.6)

## 2016-03-12 LAB — CULTURE, RESPIRATORY

## 2016-03-12 MED ORDER — MIDAZOLAM HCL 2 MG/2ML IJ SOLN
2.0000 mg | INTRAMUSCULAR | Status: DC | PRN
  Administered 2016-03-12: 2 mg via INTRAVENOUS

## 2016-03-12 MED ORDER — VITAL AF 1.2 CAL PO LIQD: 1000.0000 mL | ORAL | Status: DC

## 2016-03-12 MED ORDER — MIDAZOLAM HCL 2 MG/2ML IJ SOLN
1.0000 mg | INTRAMUSCULAR | Status: DC | PRN
  Administered 2016-03-12: 1 mg via INTRAVENOUS
  Filled 2016-03-12 (×2): qty 2

## 2016-03-12 MED ORDER — ALBUMIN HUMAN 5 % IV SOLN
12.5000 g | Freq: Four times a day (QID) | INTRAVENOUS | Status: AC
  Administered 2016-03-12 – 2016-03-13 (×4): 12.5 g via INTRAVENOUS
  Filled 2016-03-12 (×4): qty 250

## 2016-03-12 NOTE — Progress Notes (Addendum)
Nutrition Follow-up / Consult  DOCUMENTATION CODES:   Not applicable  INTERVENTION:    Increase TF goal rate to Vital AF 1.2 at 75 ml/h (1800 ml) to provide 2160 kcal, 135 gm protein, 1460 ml free water daily.  NUTRITION DIAGNOSIS:   Inadequate oral intake related to inability to eat as evidenced by NPO status.  Ongoing  GOAL:   Patient will meet greater than or equal to 90% of their needs  Progressing  MONITOR:   Diet advancement, Vent status, Labs, I & O's  REASON FOR ASSESSMENT:   Consult Enteral/tube feeding initiation and management  ASSESSMENT:   70 y/o PMHx HLD, DM, MS, CVA (01/31/16), HTN, CKD.  Pt had presented to OSH 11/1 with PNA and bilateral pleural effusions. Intubated 11/14. Transferred to [facility].   Discussed patient in ICU rounds and with RN today. Received MD Consult for TF initiation and management. Current TF order: Vital AF 1.2 at 70 ml/h to provide 2016 kcals, 126 gm protein, 1362 ml free water daily. TF turned off this AM by RN due to vomiting. RN to restart TF today. Patient is currently intubated on ventilator support MV: 13.3 L/min Temp (24hrs), Avg:100.1 F (37.8 C), Min:99.5 F (37.5 C), Max:101.2 F (38.4 C)  Propofol: none Labs reviewed: sodium is low; phosphorus elevated at 7.3 CBG's: 175-160-155 Medications reviewed.   Diet Order:  Diet NPO time specified  Skin:  Reviewed, no issues  Last BM:  Unknown  Height:   Ht Readings from Last 1 Encounters:  03/11/16 5\' 9"  (1.753 m)    Weight:   Wt Readings from Last 1 Encounters:  03/12/16 196 lb 10.4 oz (89.2 kg)    Ideal Body Weight:  72.73 kg  BMI:  Body mass index is 29.04 kg/m.  Estimated Nutritional Needs:   Kcal:  2199  Protein:  120-135 gm  Fluid:  2.2 L  EDUCATION NEEDS:   No education needs identified at this time  Joaquin Courts, RD, LDN, CNSC Pager [phone] After Hours Pager [phone]

## 2016-03-12 NOTE — Progress Notes (Signed)
  Echocardiogram 2D Echocardiogram has been performed.  [name], Duane Harper M 03/12/2016, 10:54 AM

## 2016-03-12 NOTE — Consult Note (Signed)
Duane Harper is 70 y.o. 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 Had left sided deficits from this CVA as well as cognitive changes. While undergoing rehab for his CVA, he developed dyspnea. Was diagnosed with pneumonia and bilateral pleural effusions. Patient was started on Levaquin and required supplemental O2. CT placed. Patient's pleural fluid isolated Citrobacter. Patient was treated with Lasix and antibiotics. On 11/13, patient had a syncopal episode while participating in PT. He was found to be hypotensive at that time. Patient continued to remain febrile with worsening hypoxia despite supplemental O2 and abx coverage. CTA chest obtained which was negative for PE and slight progression of severe multilobar PNA compared to prior CT.  On 11/14, patient intubated as his WOB was significant on BiPAP. Reported discussions with wife regarding patient's wishes. She reports that he would not want to remain on ventilator for long term. He would not wish to have a tracheostomy or a PEG tube. His wish would be to be a DNR. He would not want chest compressions or defibrillation. She would agree to short term dialysis. Creat was 1.21 on 08/06/14 and 3.66 on 03/13/2016, increase to 7.07 today with oliguria. BP is soft but he is not receiving pressors. CVP today was 7 & 4, and hgb 8.4gm.  Past Medical History:  Diagnosis Date  . Diabetes ([place])   . Hyperlipemia   . Multiple sclerosis ([place])   . Stroke [facility]) 2007   Past Surgical History:  Procedure Laterality Date  . brain stent    . CATARACT EXTRACTION, BILATERAL  10/2015  . HAND TENDON SURGERY Left    Social History:  reports that he has been smoking.  He has never used smokeless tobacco. He reports that he drinks about 4.2 - 8.4 oz of alcohol per week . He reports that he does not use drugs. Allergies: No Known Allergies Family History  Problem Relation Age of Onset  . Heart attack Father   . Restless legs syndrome  Father   . Stroke    . Heart attack      Medications:  Scheduled: . chlorhexidine gluconate (MEDLINE KIT)  15 mL Mouth Rinse BID  . famotidine  20 mg Per Tube QHS  . heparin subcutaneous  5,000 Units Subcutaneous Q8H  . insulin aspart  1-3 Units Subcutaneous Q4H  . ipratropium-albuterol  3 mL Nebulization Q6H  . mouth rinse  15 mL Mouth Rinse 10 times per day  . meropenem (MERREM) IV  500 mg Intravenous Q12H     ROS: Not obtainable Blood pressure 98/84, pulse (!) 106, temperature (!) 101.2 F (38.4 C), temperature source Oral, resp. rate (!) 37, height 5' 9"  (1.753 m), weight 89.2 kg (196 lb 10.4 oz), SpO2 96 %.  General appearance: not reponsive Head: Normocephalic, without obvious abnormality, atraumatic, oral intubation Eyes: negative Ears: normal TM's and external ear canals both ears Resp: clear to auscultation bilaterally and anteriroly, CT on L Chest wall: no tenderness Cardio: regular rate and rhythm, S1, S2 normal, no murmur, click, rub or gallop GI: soft, non-tender; bowel sounds normal; no masses,  no organomegaly Extremities: edema tr Skin: Skin color, texture, turgor normal. No rashes or lesions Neurologic: Mental status: not repsonding Results for orders placed or performed during the hospital encounter of 03/20/2016 (from the past 48 hour(s))  Glucose, capillary     Status: Abnormal   Collection Time: 03/10/16  5:17 PM  Result Value Ref Range   Glucose-Capillary  162 (H) 65 - 99 mg/dL   Comment 1 Notify RN    Comment 2 Document in Chart   Body fluid culture     Status: None (Preliminary result)   Collection Time: 03/10/16  5:46 PM  Result Value Ref Range   Specimen Description PLEURAL RIGHT CHEST TUBE    Special Requests NONE    Gram Stain      ABUNDANT WBC PRESENT, PREDOMINANTLY PMN NO ORGANISMS SEEN    Culture NO GROWTH 2 DAYS    Report Status PENDING   Body fluid culture     Status: None (Preliminary result)   Collection Time: 03/10/16  5:49 PM   Result Value Ref Range   Specimen Description PLEURAL LEFT CHEST TUBE    Special Requests NONE    Gram Stain      FEW WBC PRESENT, PREDOMINANTLY PMN NO ORGANISMS SEEN    Culture NO GROWTH 2 DAYS    Report Status PENDING   Lactate dehydrogenase, body fluid     Status: Abnormal   Collection Time: 03/10/16  6:12 PM  Result Value Ref Range   LD, Fluid 499 (H) 3 - 23 U/L    Comment: (NOTE) Results should be evaluated in conjunction with serum values    Fluid Type-FLDH Pleural, L   Body fluid cell count with differential     Status: Abnormal   Collection Time: 03/10/16  6:12 PM  Result Value Ref Range   Fluid Type-FCT L,PLEURAL    Color, Fluid RED (A) YELLOW   Appearance, Fluid CLOUDY (A) CLEAR   WBC, Fluid 758 0 - 1,000 cu mm   Neutrophil Count, Fluid 51 (H) 0 - 25 %   Lymphs, Fluid 24 %   Monocyte-Macrophage-Serous Fluid 25 (L) 50 - 90 %   Other Cells, Fluid OTHER CELLS IDENTIFIED AS MESOTHELIAL CELLS %  Pathologist smear review     Status: None   Collection Time: 03/10/16  6:12 PM  Result Value Ref Range   Path Review No malignant cells identified.     Comment: Acute inflammation present. Peripheral blood elements present. Reviewed by Audree Camel Hillard, MD 03/12/16.   Glucose, pleural or peritoneal fluid     Status: None   Collection Time: 03/10/16  6:15 PM  Result Value Ref Range   Glucose, Fluid 135 mg/dL    Comment: (NOTE) No normal range established for this test Results should be evaluated in conjunction with serum values    Fluid Type-FGLU Pleural, L   Lactate dehydrogenase, body fluid     Status: Abnormal   Collection Time: 03/10/16  6:18 PM  Result Value Ref Range   LD, Fluid 253 (H) 3 - 23 U/L    Comment: (NOTE) Results should be evaluated in conjunction with serum values    Fluid Type-FLDH Pleural R   Body fluid cell count with differential     Status: Abnormal   Collection Time: 03/10/16  6:18 PM  Result Value Ref Range   Fluid Type-FCT PLEURAL R     Color, Fluid YELLOW (A) YELLOW   Appearance, Fluid HAZY (A) CLEAR   WBC, Fluid 1,023 (H) 0 - 1,000 cu mm   Neutrophil Count, Fluid 81 (H) 0 - 25 %   Lymphs, Fluid 8 %   Monocyte-Macrophage-Serous Fluid 11 (L) 50 - 90 %   Other Cells, Fluid OTHER CELLS IDENTIFIED AS MESOTHELIAL CELLS %  Glucose, pleural or peritoneal fluid     Status: None   Collection Time: 03/10/16  6:18 PM  Result Value Ref Range   Glucose, Fluid 139 mg/dL    Comment: (NOTE) No normal range established for this test Results should be evaluated in conjunction with serum values    Fluid Type-FGLU Pleural R   Pathologist smear review     Status: None   Collection Time: 03/10/16  6:18 PM  Result Value Ref Range   Path Review No malignant cells identified.     Comment: Acute inflammation present. Peripheral blood elements present. Reviewed by Audree Camel Hillard, MD 03/12/16.   Glucose, capillary     Status: Abnormal   Collection Time: 03/10/16  8:20 PM  Result Value Ref Range   Glucose-Capillary 153 (H) 65 - 99 mg/dL   Comment 1 Notify RN   Blood gas, arterial     Status: Abnormal   Collection Time: 03/10/16  8:22 PM  Result Value Ref Range   FIO2 50.00    Delivery systems VENTILATOR    Mode PRESSURE REGULATED VOLUME CONTROL    VT 570 mL   LHR 35 resp/min   Peep/cpap 5.0 cm H20   pH, Arterial 7.328 (L) 7.350 - 7.450   pCO2 arterial 33.9 32.0 - 48.0 mmHg   pO2, Arterial 89.1 83.0 - 108.0 mmHg   Bicarbonate 17.3 (L) 20.0 - 28.0 mmol/L   Acid-base deficit 7.5 (H) 0.0 - 2.0 mmol/L   O2 Saturation 96.5 %   Patient temperature 98.6    Collection site RIGHT RADIAL    Drawn by [id number]    Sample type ARTERIAL DRAW    Allens test (pass/fail) PASS PASS  Glucose, capillary     Status: Abnormal   Collection Time: 03/10/16 11:50 PM  Result Value Ref Range   Glucose-Capillary 104 (H) 65 - 99 mg/dL   Comment 1 Notify RN   Glucose, capillary     Status: None   Collection Time: 03/11/16  3:59 AM  Result Value Ref  Range   Glucose-Capillary 96 65 - 99 mg/dL   Comment 1 Notify RN   Renal function panel     Status: Abnormal   Collection Time: 03/11/16  5:40 AM  Result Value Ref Range   Sodium 134 (L) 135 - 145 mmol/L   Potassium 4.1 3.5 - 5.1 mmol/L   Chloride 105 101 - 111 mmol/L   CO2 21 (L) 22 - 32 mmol/L   Glucose, Bld 109 (H) 65 - 99 mg/dL   BUN 51 (H) 6 - 20 mg/dL   Creatinine, Ser 5.62 (H) 0.61 - 1.24 mg/dL   Calcium 8.0 (L) 8.9 - 10.3 mg/dL   Phosphorus 6.5 (H) 2.5 - 4.6 mg/dL   Albumin 1.3 (L) 3.5 - 5.0 g/dL   GFR calc non Af Amer 9 (L) >60 mL/min   GFR calc Af Amer 11 (L) >60 mL/min    Comment: (NOTE) The eGFR has been calculated using the CKD EPI equation. This calculation has not been validated in all clinical situations. eGFR's persistently <60 mL/min signify possible Chronic Kidney Disease.    Anion gap 8 5 - 15  Magnesium     Status: None   Collection Time: 03/11/16  5:40 AM  Result Value Ref Range   Magnesium 1.8 1.7 - 2.4 mg/dL  CBC with Differential/Platelet     Status: Abnormal   Collection Time: 03/11/16  5:40 AM  Result Value Ref Range   WBC 10.3 4.0 - 10.5 K/uL   RBC 3.14 (L) 4.22 - 5.81 MIL/uL   Hemoglobin 8.7 (L)  13.0 - 17.0 g/dL   HCT 25.9 (L) 39.0 - 52.0 %   MCV 82.5 78.0 - 100.0 fL   MCH 27.7 26.0 - 34.0 pg   MCHC 33.6 30.0 - 36.0 g/dL   RDW 15.1 11.5 - 15.5 %   Platelets 203 150 - 400 K/uL   Neutrophils Relative % 71 %   Neutro Abs 7.3 1.7 - 7.7 K/uL   Lymphocytes Relative 12 %   Lymphs Abs 1.2 0.7 - 4.0 K/uL   Monocytes Relative 13 %   Monocytes Absolute 1.3 (H) 0.1 - 1.0 K/uL   Eosinophils Relative 4 %   Eosinophils Absolute 0.4 0.0 - 0.7 K/uL   Basophils Relative 0 %   Basophils Absolute 0.0 0.0 - 0.1 K/uL  Glucose, capillary     Status: None   Collection Time: 03/11/16  8:02 AM  Result Value Ref Range   Glucose-Capillary 98 65 - 99 mg/dL   Comment 1 Notify RN    Comment 2 Document in Chart   Basic metabolic panel     Status: Abnormal    Collection Time: 03/11/16  8:18 AM  Result Value Ref Range   Sodium 135 135 - 145 mmol/L   Potassium 4.2 3.5 - 5.1 mmol/L   Chloride 105 101 - 111 mmol/L   CO2 20 (L) 22 - 32 mmol/L   Glucose, Bld 112 (H) 65 - 99 mg/dL   BUN 52 (H) 6 - 20 mg/dL   Creatinine, Ser 5.94 (H) 0.61 - 1.24 mg/dL   Calcium 7.9 (L) 8.9 - 10.3 mg/dL   GFR calc non Af Amer 9 (L) >60 mL/min   GFR calc Af Amer 10 (L) >60 mL/min    Comment: (NOTE) The eGFR has been calculated using the CKD EPI equation. This calculation has not been validated in all clinical situations. eGFR's persistently <60 mL/min signify possible Chronic Kidney Disease.    Anion gap 10 5 - 15  Magnesium     Status: None   Collection Time: 03/11/16  8:18 AM  Result Value Ref Range   Magnesium 1.9 1.7 - 2.4 mg/dL  Phosphorus     Status: Abnormal   Collection Time: 03/11/16  8:18 AM  Result Value Ref Range   Phosphorus 6.2 (H) 2.5 - 4.6 mg/dL  I-STAT 3, arterial blood gas (G3+)     Status: Abnormal   Collection Time: 03/11/16  9:00 AM  Result Value Ref Range   pH, Arterial 7.381 7.350 - 7.450   pCO2 arterial 32.4 32.0 - 48.0 mmHg   pO2, Arterial 92.0 83.0 - 108.0 mmHg   Bicarbonate 19.1 (L) 20.0 - 28.0 mmol/L   TCO2 20 0 - 100 mmol/L   O2 Saturation 97.0 %   Acid-base deficit 5.0 (H) 0.0 - 2.0 mmol/L   Patient temperature 99.6 F    Collection site RADIAL, ALLEN'S TEST ACCEPTABLE    Drawn by RT    Sample type ARTERIAL   Blood gas, arterial     Status: Abnormal   Collection Time: 03/11/16 10:35 AM  Result Value Ref Range   FIO2 50.00    Delivery systems VENTILATOR    Mode PRESSURE REGULATED VOLUME CONTROL    VT 500 mL   LHR 35 resp/min   Peep/cpap 5.0 cm H20   pH, Arterial 7.318 (L) 7.350 - 7.450   pCO2 arterial 42.0 32.0 - 48.0 mmHg   pO2, Arterial 91.3 83.0 - 108.0 mmHg   Bicarbonate 20.7 20.0 - 28.0 mmol/L  Acid-base deficit 4.3 (H) 0.0 - 2.0 mmol/L   O2 Saturation 96.3 %   Patient temperature 99.6    Collection site  RIGHT RADIAL    Drawn by [id number]    Sample type ARTERIAL DRAW    Allens test (pass/fail) PASS PASS  Glucose, capillary     Status: Abnormal   Collection Time: 03/11/16 12:07 PM  Result Value Ref Range   Glucose-Capillary 127 (H) 65 - 99 mg/dL   Comment 1 Notify RN    Comment 2 Document in Chart   Glucose, capillary     Status: Abnormal   Collection Time: 03/11/16  4:34 PM  Result Value Ref Range   Glucose-Capillary 136 (H) 65 - 99 mg/dL   Comment 1 Notify RN    Comment 2 Document in Chart   Basic metabolic panel     Status: Abnormal   Collection Time: 03/11/16  5:14 PM  Result Value Ref Range   Sodium 134 (L) 135 - 145 mmol/L   Potassium 4.4 3.5 - 5.1 mmol/L   Chloride 103 101 - 111 mmol/L   CO2 20 (L) 22 - 32 mmol/L   Glucose, Bld 138 (H) 65 - 99 mg/dL   BUN 56 (H) 6 - 20 mg/dL   Creatinine, Ser 6.33 (H) 0.61 - 1.24 mg/dL   Calcium 7.8 (L) 8.9 - 10.3 mg/dL   GFR calc non Af Amer 8 (L) >60 mL/min   GFR calc Af Amer 9 (L) >60 mL/min    Comment: (NOTE) The eGFR has been calculated using the CKD EPI equation. This calculation has not been validated in all clinical situations. eGFR's persistently <60 mL/min signify possible Chronic Kidney Disease.    Anion gap 11 5 - 15  Magnesium     Status: None   Collection Time: 03/11/16  5:14 PM  Result Value Ref Range   Magnesium 2.0 1.7 - 2.4 mg/dL  Phosphorus     Status: Abnormal   Collection Time: 03/11/16  5:14 PM  Result Value Ref Range   Phosphorus 7.0 (H) 2.5 - 4.6 mg/dL  Glucose, capillary     Status: Abnormal   Collection Time: 03/11/16  8:19 PM  Result Value Ref Range   Glucose-Capillary 144 (H) 65 - 99 mg/dL   Comment 1 Notify RN   Glucose, capillary     Status: Abnormal   Collection Time: 03/12/16 12:01 AM  Result Value Ref Range   Glucose-Capillary 148 (H) 65 - 99 mg/dL   Comment 1 Notify RN   Comprehensive metabolic panel     Status: Abnormal   Collection Time: 03/12/16  2:17 AM  Result Value Ref Range   Sodium  134 (L) 135 - 145 mmol/L   Potassium 4.2 3.5 - 5.1 mmol/L   Chloride 102 101 - 111 mmol/L   CO2 23 22 - 32 mmol/L   Glucose, Bld 144 (H) 65 - 99 mg/dL   BUN 57 (H) 6 - 20 mg/dL   Creatinine, Ser 6.58 (H) 0.61 - 1.24 mg/dL   Calcium 7.9 (L) 8.9 - 10.3 mg/dL   Total Protein 4.8 (L) 6.5 - 8.1 g/dL   Albumin 1.3 (L) 3.5 - 5.0 g/dL   AST 32 15 - 41 U/L   ALT 22 17 - 63 U/L   Alkaline Phosphatase 153 (H) 38 - 126 U/L   Total Bilirubin 0.7 0.3 - 1.2 mg/dL   GFR calc non Af Amer 8 (L) >60 mL/min   GFR calc Af Amer 9 (L) >  60 mL/min    Comment: (NOTE) The eGFR has been calculated using the CKD EPI equation. This calculation has not been validated in all clinical situations. eGFR's persistently <60 mL/min signify possible Chronic Kidney Disease.    Anion gap 9 5 - 15  CBC with Differential/Platelet     Status: Abnormal   Collection Time: 03/12/16  2:17 AM  Result Value Ref Range   WBC 9.2 4.0 - 10.5 K/uL   RBC 3.09 (L) 4.22 - 5.81 MIL/uL   Hemoglobin 8.4 (L) 13.0 - 17.0 g/dL   HCT 25.4 (L) 39.0 - 52.0 %   MCV 82.2 78.0 - 100.0 fL   MCH 27.2 26.0 - 34.0 pg   MCHC 33.1 30.0 - 36.0 g/dL   RDW 15.0 11.5 - 15.5 %   Platelets 198 150 - 400 K/uL   Neutrophils Relative % 67 %   Neutro Abs 6.1 1.7 - 7.7 K/uL   Lymphocytes Relative 17 %   Lymphs Abs 1.6 0.7 - 4.0 K/uL   Monocytes Relative 12 %   Monocytes Absolute 1.1 (H) 0.1 - 1.0 K/uL   Eosinophils Relative 4 %   Eosinophils Absolute 0.4 0.0 - 0.7 K/uL   Basophils Relative 0 %   Basophils Absolute 0.0 0.0 - 0.1 K/uL  Magnesium     Status: None   Collection Time: 03/12/16  2:17 AM  Result Value Ref Range   Magnesium 2.0 1.7 - 2.4 mg/dL  Phosphorus     Status: Abnormal   Collection Time: 03/12/16  2:17 AM  Result Value Ref Range   Phosphorus 7.3 (H) 2.5 - 4.6 mg/dL  Glucose, capillary     Status: Abnormal   Collection Time: 03/12/16  4:27 AM  Result Value Ref Range   Glucose-Capillary 175 (H) 65 - 99 mg/dL   Comment 1 Notify RN    Glucose, capillary     Status: Abnormal   Collection Time: 03/12/16  7:29 AM  Result Value Ref Range   Glucose-Capillary 160 (H) 65 - 99 mg/dL   Comment 1 Notify RN    Comment 2 Document in Chart   Basic metabolic panel     Status: Abnormal   Collection Time: 03/12/16  8:18 AM  Result Value Ref Range   Sodium 132 (L) 135 - 145 mmol/L   Potassium 4.3 3.5 - 5.1 mmol/L   Chloride 98 (L) 101 - 111 mmol/L   CO2 22 22 - 32 mmol/L   Glucose, Bld 185 (H) 65 - 99 mg/dL   BUN 62 (H) 6 - 20 mg/dL   Creatinine, Ser 7.07 (H) 0.61 - 1.24 mg/dL   Calcium 7.6 (L) 8.9 - 10.3 mg/dL   GFR calc non Af Amer 7 (L) >60 mL/min   GFR calc Af Amer 8 (L) >60 mL/min    Comment: (NOTE) The eGFR has been calculated using the CKD EPI equation. This calculation has not been validated in all clinical situations. eGFR's persistently <60 mL/min signify possible Chronic Kidney Disease.    Anion gap 12 5 - 15  Glucose, capillary     Status: Abnormal   Collection Time: 03/12/16 11:35 AM  Result Value Ref Range   Glucose-Capillary 155 (H) 65 - 99 mg/dL   Comment 1 Notify RN    Comment 2 Document in Chart    Dg Chest Port 1 View  Result Date: 03/12/2016 CLINICAL DATA:  Right-sided chest tube, acute respiratory failure EXAM: PORTABLE CHEST 1 VIEW COMPARISON:  Portable chest x-ray of March 12, 2016 FINDINGS: The patient is rotated toward the left. The lungs are reasonably well inflated. There are confluent alveolar opacities in the upper lobes. Patchy densities are present at both lower lobes. A faint pleural line persists in the right apex. The right-sided chest tube tip lies just above the posterior aspect of the eighth rib and is stable. The left-sided chest tube lies at the base of the pleural space with the tip in the overlying the medial cardio phrenic angle. No left-sided pneumothorax is clearly evident. The cardiac silhouette is normal in size. The pulmonary vascularity is indistinct. The endotracheal tube  tip lies 5.3 cm above the carina. The esophagogastric tube tip projects below the inferior margin of the image. The left internal jugular venous catheter tip projects at the junction of the internal jugular vein with the subclavian vein on the left. IMPRESSION: Fairly stable appearance of the chest. Small right apical pneumothorax. Persistent bilateral lateral upper lobe infiltrates with mild bibasilar atelectasis or infiltrate. The support tubes are in reasonable position. Electronically Signed   By: David  Martinique M.D.   On: 03/12/2016 10:18   Dg Chest Port 1 View  Result Date: 03/12/2016 CLINICAL DATA:  Pneumonia, acute respiratory failure, intubated patient. Current smoker EXAM: PORTABLE CHEST 1 VIEW COMPARISON:  Portable chest x-ray of March 11, 2016 FINDINGS: The lungs are adequately inflated. The interstitial and alveolar infiltrates persist but are not significantly changed. There is partial obscuration of the left hemidiaphragm. Bilateral chest tubes are in stable position. A tiny less than 5% apical pneumothorax on the right cannot be excluded. The heart is normal in size. The central pulmonary vascularity is prominent. The endotracheal tube tip lies 3.9 cm above the carina. The esophagogastric tube tip in proximal port project below the inferior margin of the image. The left internal jugular venous catheter tip projects over the proximal SVC. IMPRESSION: Stable bilateral interstitial and alveolar opacities compatible with pneumonia. No definite CHF. Possible 5% or less apical pneumothorax on the right. The chest tubes are in stable position. Electronically Signed   By: David  Martinique M.D.   On: 03/12/2016 07:10   Dg Chest Port 1 View  Result Date: 03/11/2016 CLINICAL DATA:  70 year old male with a history of a pneumonia and subsequent empyema. Thoracostomy tubes 03/10/2016. EXAM: PORTABLE CHEST 1 VIEW COMPARISON:  03/10/2016, chest CT 03/10/2016 FINDINGS: Cardiomediastinal silhouette unchanged  with double density overlying the right greater than left hilar region. Endotracheal tube relatively unchanged, terminating approximately 4.9 cm above the carina. Unchanged left IJ approach central venous catheter which terminates near the confluence of the SVC and left brachiocephalic vein. Unchanged gastric tube terminating out of the field of view. Overlying EKG leads. Unchanged position of bilateral large bore thoracostomy tubes terminating near the midline. Bilateral mixed interstitial and airspace opacities, without significant change from the comparison plain film. Blunting of the right cardiophrenic angle, left cardiophrenic angle, and the left costophrenic angle. No visualized pneumothorax. IMPRESSION: Mixed bilateral interstitial and airspace opacity, without significant change from the comparison. Unchanged bilateral thoracostomy tubes. No significant residual pleural effusion. Unchanged endotracheal tube, left IJ central venous catheter, and gastric tube. Signed, Dulcy Fanny. Earleen Newport, DO [facility] Electronically Signed   By: Corrie Mckusick D.O.   On: 03/11/2016 14:17   Dg Chest Port 1 View  Result Date: 03/10/2016 CLINICAL DATA:  Central line placement.  Initial encounter. EXAM: PORTABLE CHEST 1 VIEW COMPARISON:  Chest radiograph performed earlier today at 4:53 p.m. FINDINGS:  The patient's endotracheal tube is seen ending 5-6 cm above the carina. A left IJ line is noted ending about the proximal to mid SVC. Bibasilar chest tubes are noted. No pneumothorax is seen. Bilateral upper lobe and bibasilar airspace opacities remain concerning for multifocal pneumonia, perhaps slightly worsened from the prior study. Small bilateral pleural effusions are suspected. No pneumothorax is seen. The cardiomediastinal silhouette is mildly enlarged. No acute osseous abnormalities are identified. Soft tissue air is noted along the left chest wall. IMPRESSION: 1.  Endotracheal tube seen ending 5-6 cm above the carina. 2. Left IJ line noted ending about the proximal to mid SVC. 3. Bilateral upper lobe and bibasilar airspace opacities remain concerning for multifocal pneumonia, perhaps slightly worsened from the prior study. Suspect small bilateral pleural effusions. 4. Mild cardiomegaly. Electronically Signed   By: Garald Balding M.D.   On: 03/10/2016 18:39   Dg Chest Port 1 View  Result Date: 03/10/2016 CLINICAL DATA:  Bilateral chest tube placement EXAM: PORTABLE CHEST 1 VIEW COMPARISON:  CT chest 03/10/2016 FINDINGS: Bilateral basilar chest tubes. No pneumothorax. Bilateral upper lobe airspace disease. Left lower lobe airspace disease. Stable cardiomediastinal silhouette. No acute osseous abnormality. Endotracheal tube with the tip 4 cm above the carina. Nasogastric tube coursing below the diaphragm. IMPRESSION: 1. New endotracheal tube with the tip 4 cm above the carina. 2. Bibasilar chest tubes without a pneumothorax. 3. Bilateral upper lobe and left lower lobe airspace disease concerning for multilobar pneumonia. Electronically Signed   By: Kathreen Devoid   On: 03/10/2016 17:20    Assessment:  1 AKI, hemodynamically mediated. oliguric 2 Intravascular volume depletion 3 CVA 4 VDRF 5 PNA/empyema/CT drainage Plan: 1 On paper, he is a poor candidate for long term dialysis given coorbidities 2 Trial of colloid-blood/albumin/saline to optimize hemodynamics 3 We can entertain short term intermittent dialysis if family agreable  Levonne Carreras C 03/12/2016, 3:01 PM

## 2016-03-12 NOTE — Progress Notes (Signed)
*  PRELIMINARY RESULTS* Vascular Ultrasound BIlateral lower extremity venous duplex has been completed.  Preliminary findings: No evidence of deep vein thrombosis or baker's cysts bilaterally.   Chauncey FischerCharlotte C Tynasia Mccaul 03/12/2016, 3:03 PM

## 2016-03-12 NOTE — Progress Notes (Signed)
PULMONARY / CRITICAL CARE MEDICINE   Name: Duane Harper MRN: [id number] DOB: 1946-03-13    ADMISSION DATE:  Mar 10, 2016  REFERRING MD:  [facility]   CHIEF COMPLAINT:  Respiratory Failure   HISTORY OF PRESENT ILLNESS:  History obtained from wife and records review as patient is sedated/intubated.   Duane Harper is 70 y.o. 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 Had left sided deficits from this CVA as well as cognitive changes. While undergoing rehab for his CVA, he developed dyspnea. Was diagnosed with pneumonia and bilateral pleural effusions. Patient was started on Levaquin and required supplemental O2. Patient's pleural fluid isolated Citrobacter. Fluid analysis suggested transudate process. Patient was treated with Lasix and antibiotics were broadened.  On 11/13, patient had a syncopal episode while participating in PT. He was found to be hypotensive at that time. Patient continued to remain febrile with worsening hypoxia despite supplemental O2 and abx coverage. CTA chest obtained which was negative for PE and slight progression of severe multilobar PNA compared to prior CT.   On 11/14, patient stated he desired intubation as his WOB was significant on BiPAP. Bronchoscopy was performed which showed no endobronchial lesions and minimal inflammation of the bronchi in the left upper lobe. Bronchial cultures showed normal oral respiratory flora.   Had discussion with wife regarding patient's wishes. She reports that he would not want to remain on ventilator for long term. He would not wish to have a tracheostomy or a PEG tube. His wish would be to be a DNR. He would not want chest compressions or defibrillation.   SIGNIFICANT EVENTS: 11/1: Admitted to [facility] for hypoxia and dyspnea, found to have multilobar PNA and pleural effusions  11/6: Thoracentesis performed; culture grew Citrobacter  11/13: CTA chest negative for PE  11/4: Patient  intubated  11/17: Patient started on Levophed, transferred to [facility]  11/18- chest tubes placed, came off pressors, line neck placed, fem dc'ed  SUBJECTIVE:  With decreasing uo last 24 hrs.   VITAL SIGNS: BP (!) 111/52 (BP Location: Left Arm)   Pulse (!) 102   Temp (!) 100.4 F (38 C) (Oral)   Resp (!) 25   Ht 5\' 9"  (1.753 m)   Wt 89.2 kg (196 lb 10.4 oz)   SpO2 93%   BMI 29.04 kg/m   HEMODYNAMICS: CVP:  [2 mmHg-9 mmHg] 7 mmHg  VENTILATOR SETTINGS: Vent Mode: PRVC FiO2 (%):  [40 %-50 %] 40 % Set Rate:  [35 bmp] 35 bmp Vt Set:  [500 mL] 500 mL PEEP:  [5 cmH20] 5 cmH20 Plateau Pressure:  [21 cmH20-27 cmH20] 27 cmH20  INTAKE / OUTPUT: I/O last 3 completed shifts: In: 5922.7 [I.V.:3552.7; NG/GT:1470; IV Piggyback:900] Out: 1415 [Urine:85; Chest Tube:1330]  PHYSICAL EXAMINATION: General: calm , rass -3 Neuro:  Sedated. rass -3 HEENT:  ETT in place, line clean Cardiovascular:  RRR s1 s2  Lungs:  Coarse breath sounds bilaterally, clear now bases  Abdomen:  Soft, non-distended , no r/g Musculoskeletal:  Mild edema Skin:  No rashes on exposed skin.   LABS:  BMET  Recent Labs Lab 03/11/16 1714 03/12/16 0217 03/12/16 0818  NA 134* 134* 132*  K 4.4 4.2 4.3  CL 103 102 98*  CO2 20* 23 22  BUN 56* 57* 62*  CREATININE 6.33* 6.58* 7.07*  GLUCOSE 138* 144* 185*    Electrolytes  Recent Labs Lab 03/11/16 0818 03/11/16 1714 03/12/16 0217 03/12/16 0818  CALCIUM 7.9* 7.8* 7.9* 7.6*  MG 1.9 2.0 2.0  --   PHOS 6.2* 7.0* 7.3*  --     CBC  Recent Labs Lab 03/10/16 0322 03/10/16 0936 03/11/16 0540 03/12/16 0217  WBC 11.5*  --  10.3 9.2  HGB 7.8* 8.5* 8.7* 8.4*  HCT 24.5* 26.2* 25.9* 25.4*  PLT 212  --  203 198    Coag's  Recent Labs Lab 03/10/16 0823  INR 1.23    Sepsis Markers  Recent Labs Lab Mar 19, 2016 2235 03/10/16 0823  LATICACIDVEN  --  1.1  PROCALCITON 2.24  --     ABG  Recent Labs Lab 03/10/16 2022 03/11/16 0900  03/11/16 1035  PHART 7.328* 7.381 7.318*  PCO2ART 33.9 32.4 42.0  PO2ART 89.1 92.0 91.3    Liver Enzymes  Recent Labs Lab 03/10/16 0936 03/11/16 0540 03/12/16 0217  AST 27  --  32  ALT 20  --  22  ALKPHOS 115  --  153*  BILITOT 0.8  --  0.7  ALBUMIN 1.5* 1.3* 1.3*    Cardiac Enzymes  Recent Labs Lab 03/19/16 2235 03/10/16 0322 03/10/16 0936  TROPONINI <0.03 <0.03 <0.03    Glucose  Recent Labs Lab 03/11/16 1207 03/11/16 1634 03/11/16 2019 03/12/16 0001 03/12/16 0427 03/12/16 0729  GLUCAP 127* 136* 144* 148* 175* 160*    Imaging Dg Chest Port 1 View  Result Date: 03/12/2016 CLINICAL DATA:  Pneumonia, acute respiratory failure, intubated patient. Current smoker EXAM: PORTABLE CHEST 1 VIEW COMPARISON:  Portable chest x-ray of March 11, 2016 FINDINGS: The lungs are adequately inflated. The interstitial and alveolar infiltrates persist but are not significantly changed. There is partial obscuration of the left hemidiaphragm. Bilateral chest tubes are in stable position. A tiny less than 5% apical pneumothorax on the right cannot be excluded. The heart is normal in size. The central pulmonary vascularity is prominent. The endotracheal tube tip lies 3.9 cm above the carina. The esophagogastric tube tip in proximal port project below the inferior margin of the image. The left internal jugular venous catheter tip projects over the proximal SVC. IMPRESSION: Stable bilateral interstitial and alveolar opacities compatible with pneumonia. No definite CHF. Possible 5% or less apical pneumothorax on the right. The chest tubes are in stable position. Electronically Signed   By: David  [place] M.D.   On: 03/12/2016 07:10   Dg Chest Port 1 View  Result Date: 03/11/2016 CLINICAL DATA:  70 year old male with a history of a pneumonia and subsequent empyema. Thoracostomy tubes 03/10/2016. EXAM: PORTABLE CHEST 1 VIEW COMPARISON:  03/10/2016, chest CT 03/10/2016 FINDINGS:  Cardiomediastinal silhouette unchanged with double density overlying the right greater than left hilar region. Endotracheal tube relatively unchanged, terminating approximately 4.9 cm above the carina. Unchanged left IJ approach central venous catheter which terminates near the confluence of the SVC and left brachiocephalic vein. Unchanged gastric tube terminating out of the field of view. Overlying EKG leads. Unchanged position of bilateral large bore thoracostomy tubes terminating near the midline. Bilateral mixed interstitial and airspace opacities, without significant change from the comparison plain film. Blunting of the right cardiophrenic angle, left cardiophrenic angle, and the left costophrenic angle. No visualized pneumothorax. IMPRESSION: Mixed bilateral interstitial and airspace opacity, without significant change from the comparison. Unchanged bilateral thoracostomy tubes. No significant residual pleural effusion. Unchanged endotracheal tube, left IJ central venous catheter, and gastric tube. Signed, Yvone Neu. Loreta Ave, DO [facility] Electronically Signed   By: Gilmer Mor D.O.  On: 03/11/2016 14:17    STUDIES:  CT chest 11/18>>>Extensive bilateral pulmonary opacities worrisome for an inflammatory process, ARDS, or edema are stable. Malignancy is not excluded.Moderate bilateral pleural effusions have increased.  CULTURES: 11/6 pleural fluid L with Citrobacter sensitive to Cefepime, Meropenem. Resistant to cefazolin 11/18 rt effusion>>> 11/18 left effusion>>> 11/17 BC>>> 11/17 sputum>>>  ANTIBIOTICS: Vancomycin 11/17 >> 11/20 Meropenem 11/17 >>   LINES/TUBES: ETT 11/14 >>  Right Femoral 11/17 >>11/18 Left IJ 11/18>>> Rt chest tuibe 11/18>>> Left chest tuber 11/18>>>  DISCUSSION: Duane Harper is 70 y.o. male with PMH of significant tobacco use, recent CVA, MS, HTN, CKD, and BPH who was transferred from [facility] for acute respiratory failure requiring intubation in the setting of multilobar PNA and pleural effusions that cultured Citrobacter. Patient was also persistently hypotensive requiring pressor support.   ASSESSMENT / PLAN:  PULMONARY A: Acute Hypoxemic Hypercapneic resp fx 2/2 Empyema, B, with Citrobacter in pleural fluid (11/6) with Multilobar PNA S/P Chest tube R and L, 11/18 H/o tobacco abuse, likely COPD  P:   Chest tubes to water seal On meropenem May need lytics/tpa/alteplase.   CARDIOVASCULAR A:  H/o HTN  Hypotension  P:  Levophed with MAP goal >65 -off after chest tubes placed > off levophed.  Echo pending   RENAL A:   AoCKD (bl 1.3-1.5)  Non-anion gap metabolic acidosis imrpoved BPH P:   Off lasix. 7L (+). D/w wife re: HD.  She is OK if temporary. Will consult renal.    GASTROINTESTINAL A:   On TF P:   IV Pepcid  TFs continue  HEMATOLOGIC A:   Acute anemia, s/p 1u PRBC with appropriate response  likley dilution anemia P:  Monitor CBC  Transfuse HgB <7  Given clotting up upper vessels, will doppler legs  INFECTIOUS A:   Multilobar PNA , thought 2/2 to Citrobacter  Empyema, likely bilateral ( low yield possible after abx at [facility]) P:   cont Meropenem.  chest tube drainage. May need tpa/alteplase Wife does not want heroics, VTS etc  ENDOCRINE A:   T2DM, controlled P:   Sensitive SSI   NEUROLOGIC A:   H/o MS  Recent CVA (10/17)  Sedated  P:   RASS goal: 0 Fentanyl drip and versed pushes. May need versed drip.  WUA   FAMILY  - Updates: wife updated at bedside 11/20  - Inter-disciplinary family meet or Palliative Care meeting due by: 11/24  Ccm time 30 min   J. Alexis FrockAngelo A de Dios, MD 03/12/2016, 9:59 AM [place] [facility] Pager [phone] After 3 pm or if no answer, call [phone]

## 2016-03-12 NOTE — Progress Notes (Signed)
Per patients husband, would not like for tracheostomy to be performed 11/21.  Husband to visit with Md on 11/21 to discuss if trach is needed.  Patient with decreased urine output, lasix given.  No response. Dr Christene Slates aware.  Will monitor.

## 2016-03-13 ENCOUNTER — Inpatient Hospital Stay (HOSPITAL_COMMUNITY): Payer: PPO

## 2016-03-13 DIAGNOSIS — J9601 Acute respiratory failure with hypoxia: Secondary | ICD-10-CM

## 2016-03-13 DIAGNOSIS — N179 Acute kidney failure, unspecified: Secondary | ICD-10-CM

## 2016-03-13 LAB — BODY FLUID CULTURE
Culture: NO GROWTH
Culture: NO GROWTH

## 2016-03-13 LAB — CBC
HCT: 22.5 % — ABNORMAL LOW (ref 39.0–52.0)
Hemoglobin: 7.4 g/dL — ABNORMAL LOW (ref 13.0–17.0)
MCH: 27 pg (ref 26.0–34.0)
MCHC: 32.9 g/dL (ref 30.0–36.0)
MCV: 82.1 fL (ref 78.0–100.0)
PLATELETS: 179 10*3/uL (ref 150–400)
RBC: 2.74 MIL/uL — ABNORMAL LOW (ref 4.22–5.81)
RDW: 15.4 % (ref 11.5–15.5)
WBC: 7.6 10*3/uL (ref 4.0–10.5)

## 2016-03-13 LAB — PHOSPHORUS: PHOSPHORUS: 6.9 mg/dL — AB (ref 2.5–4.6)

## 2016-03-13 LAB — TRIGLYCERIDES: Triglycerides: 168 mg/dL — ABNORMAL HIGH (ref <150)

## 2016-03-13 LAB — BASIC METABOLIC PANEL
Anion gap: 12 (ref 5–15)
BUN: 70 mg/dL — AB (ref 6–20)
CALCIUM: 8 mg/dL — AB (ref 8.9–10.3)
CO2: 22 mmol/L (ref 22–32)
Chloride: 102 mmol/L (ref 101–111)
Creatinine, Ser: 7.82 mg/dL — ABNORMAL HIGH (ref 0.61–1.24)
GFR calc Af Amer: 7 mL/min — ABNORMAL LOW (ref >60)
GFR, EST NON AFRICAN AMERICAN: 6 mL/min — AB (ref >60)
GLUCOSE: 88 mg/dL (ref 65–99)
Potassium: 4 mmol/L (ref 3.5–5.1)
SODIUM: 136 mmol/L (ref 135–145)

## 2016-03-13 LAB — GLUCOSE, CAPILLARY
GLUCOSE-CAPILLARY: 95 mg/dL (ref 65–99)
GLUCOSE-CAPILLARY: 84 mg/dL (ref 65–99)
GLUCOSE-CAPILLARY: 98 mg/dL (ref 65–99)
GLUCOSE-CAPILLARY: 94 mg/dL (ref 65–99)
GLUCOSE-CAPILLARY: 97 mg/dL (ref 65–99)

## 2016-03-13 LAB — MAGNESIUM: MAGNESIUM: 2.1 mg/dL (ref 1.7–2.4)

## 2016-03-13 MED ORDER — POLYETHYLENE GLYCOL 3350 17 G PO PACK
17.0000 g | PACK | Freq: Every day | ORAL | Status: DC
  Administered 2016-03-13: 17 g via ORAL
  Filled 2016-03-13 (×2): qty 1

## 2016-03-13 NOTE — Care Management Important Message (Signed)
Important Message  Patient Details  Name: Duane Harper MRN: [id number] Date of Birth: 30-Aug-1945   Medicare Important Message Given:  Yes    Kyla Balzarine 03/13/2016, 9:36 AM

## 2016-03-13 NOTE — Progress Notes (Addendum)
PULMONARY / CRITICAL CARE MEDICINE   Name: Duane Harper MRN: [id number] DOB: 1946-04-09    ADMISSION DATE:  2016-03-19  REFERRING MD:  [facility]   CHIEF COMPLAINT:  Respiratory Failure   Brief:  History obtained from wife and records review as patient is sedated/intubated.   Duane Harper is 70 y.o. 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 Had left sided deficits from this CVA as well as cognitive changes. While undergoing rehab for his CVA, he developed dyspnea. Was diagnosed with pneumonia and bilateral pleural effusions. Patient was started on Levaquin and required supplemental O2. Patient's pleural fluid isolated Citrobacter. Fluid analysis suggested transudate process. Patient was treated with Lasix and antibiotics were broadened.  On 11/13, patient had a syncopal episode while participating in PT. He was found to be hypotensive at that time. Patient continued to remain febrile with worsening hypoxia despite supplemental O2 and abx coverage. CTA chest obtained which was negative for PE and slight progression of severe multilobar PNA compared to prior CT.   On 11/14, patient stated he desired intubation as his WOB was significant on BiPAP. Bronchoscopy was performed which showed no endobronchial lesions and minimal inflammation of the bronchi in the left upper lobe. Bronchial cultures showed normal oral respiratory flora.   Had discussion with wife regarding patient's wishes. She reports that he would not want to remain on ventilator for long term. He would not wish to have a tracheostomy or a PEG tube. His wish would be to be a DNR. He would not want chest compressions or defibrillation.   Imaging Dg Chest Port 1 View 11/20:  Fairly stable appearance of the chest. Small right apical pneumothorax. Persistent bilateral lateral upper lobe infiltrates with mild bibasilar atelectasis or infiltrate. The support tubes are in reasonable position.  11/21:  Tube and catheter positions are unchanged. Small apical pneumothorax on each side persist. No tension component. Airspace consolidation in both upper lobes and left base are stable. No new opacity. Stable cardiac silhouette. There is aortic atherosclerosis.    STUDIES:  CT chest 11/18>>>Extensive bilateral pulmonary opacities worrisome for an inflammatory process, ARDS, or edema are stable. Malignancy is not excluded.Moderate bilateral pleural effusions have increased.  CULTURES: 11/6 pleural fluid L with Citrobacter sensitive to Cefepime, Meropenem. Resistant to cefazolin 11/18 rt effusion>>>NGTD 11/18 left effusion>>>NGTD 11/17 BC>>>NGTD 11/17 sputum>>>Normal flora  ANTIBIOTICS: Vancomycin 11/17 >> 11/20 Meropenem 11/17 >>   LINES/TUBES: ETT 11/14 >>  Right Femoral 11/17 >>11/18 Left IJ 11/18>>> Rt chest tuibe 11/18>>> Left chest tube 11/18>>> Foley 11/15>>  SIGNIFICANT EVENTS: 11/1: Admitted to [facility] for hypoxia and dyspnea, found to have multilobar PNA and pleural effusions  11/6: Thoracentesis performed; culture grew Citrobacter  11/13: CTA chest negative for PE  11/4: Patient intubated  11/17: Patient started on Levophed, transferred to [facility]  11/18- chest tubes placed, came off pressors, line neck placed, fem dc'ed 11/20 Worsening AKI. Nephro consulted  SUBJECTIVE:  Per RN, patient had emesis last night. Residual about 800 cc and TF held.  Patient deeply sedated on vent. TF temporarily off. Wife at bedside. Interested in temporary dialysis.   VITAL SIGNS: BP (!) 109/54   Pulse 68   Temp 98.9 F (37.2 C) (Oral)   Resp (!) 35   Ht 5\' 9"  (1.753 m)   Wt 197 lb 15.6 oz (89.8 kg)   SpO2 97%   BMI 29.24 kg/m   HEMODYNAMICS: CVP:  [4 mmHg-9  mmHg] 8 mmHg  VENTILATOR SETTINGS: Vent Mode: PRVC FiO2 (%):  [40 %] 40 % Set Rate:  [35 bmp] 35 bmp Vt Set:  [500 mL] 500 mL PEEP:  [5 cmH20] 5 cmH20 Plateau Pressure:  [21 cmH20-26 cmH20] 22  cmH20  INTAKE / OUTPUT: I/O last 3 completed shifts: In: 4752.7 [I.V.:1992.7; NG/GT:1110; IV Piggyback:1650] Out: 1925 [Urine:70; Other:775; Chest Tube:1080]  PHYSICAL EXAMINATION: General: calm , rass -3 Neuro:  Sedated. rass -3 HEENT:  ETT in place,  synchronous with vent Cardiovascular:  RRR s1 s2  Lungs:  Coarse breath sounds bilaterally, no wheeze or crackles Abdomen:  Soft, non-distended , no r/g Musculoskeletal:  Mild edema Skin:  No rashes on exposed skin.   LABS:  BMET  Recent Labs Lab 03/12/16 0818 03/12/16 1822 03/13/16 0445  NA 132* 135 136  K 4.3 4.2 4.0  CL 98* 102 102  CO2 22 23 22   BUN 62* 66* 70*  CREATININE 7.07* 7.35* 7.82*  GLUCOSE 185* 109* 88    Electrolytes  Recent Labs Lab 03/12/16 0217 03/12/16 0818 03/12/16 1822 03/13/16 0445  CALCIUM 7.9* 7.6* 7.8* 8.0*  MG 2.0  --  2.1 2.1  PHOS 7.3*  --  6.7* 6.9*    CBC  Recent Labs Lab 03/11/16 0540 03/12/16 0217 03/13/16 0445  WBC 10.3 9.2 7.6  HGB 8.7* 8.4* 7.4*  HCT 25.9* 25.4* 22.5*  PLT 203 198 179    Coag's  Recent Labs Lab 03/10/16 0823  INR 1.23    Sepsis Markers  Recent Labs Lab 02/23/2016 2235 03/10/16 0823  LATICACIDVEN  --  1.1  PROCALCITON 2.24  --     ABG  Recent Labs Lab 03/10/16 2022 03/11/16 0900 03/11/16 1035  PHART 7.328* 7.381 7.318*  PCO2ART 33.9 32.4 42.0  PO2ART 89.1 92.0 91.3    Liver Enzymes  Recent Labs Lab 03/10/16 0936 03/11/16 0540 03/12/16 0217  AST 27  --  32  ALT 20  --  22  ALKPHOS 115  --  153*  BILITOT 0.8  --  0.7  ALBUMIN 1.5* 1.3* 1.3*    Cardiac Enzymes  Recent Labs Lab 03/22/2016 2235 03/10/16 0322 03/10/16 0936  TROPONINI <0.03 <0.03 <0.03    Glucose  Recent Labs Lab 03/12/16 1135 03/12/16 1524 03/12/16 1939 03/12/16 2340 03/13/16 0341 03/13/16 0742  GLUCAP 155* 124* 114* 104* 95 84     DISCUSSION: Duane Harper is 70 y.o. male with PMH of significant tobacco use, recent CVA, MS, HTN,  CKD, and BPH who was transferred [facility] for acute respiratory failure requiring intubation in the setting of multilobar PNA and pleural effusions that cultured Citrobacter. Patient was also persistently hypotensive requiring pressor support.   ASSESSMENT / PLAN:  PULMONARY A: Acute Hypoxemic Hypercapneic resp fx 2/2 Empyema, B, with Citrobacter in pleural fluid (11/6) with Multilobar PNA S/P Chest tube R and L, 11/18, with small apical PTX, bilateral.  H/o tobacco abuse, likely COPD  P:   Continue vent. SAT/SBT as able Chest tubes to water seal On meropenem 11/17>> May need lytics/tpa/pulmozyme  CARDIOVASCULAR A:  H/o HTN  Echo 11/20 with EF of 60-65%, mod LVH and PAPP 51 History of UE DVT. LE doppler 11/20 without DVT Hypotension. Off pressor 11/19 P:  Albumin 12.5 mg q6h by nephro > got 4 doses KVO Levophed as needed for MAP < 65. Off this since 11/19  RENAL A:   AKI on CKD 2/2 sepsis/hypovolemia Non-anion gap metabolic acidosis: resolved BPH HAoCKD  st 3A (Creat 1.3 in 10/2015) Anuria. UOP 40cc/+1.4/+8L P:   Off lasix. 8L (+). Appreciate renal recs:  -Poor candidate for long term HD  -Trial of colloid-blood/albumin/saline to optimize hemodynamics > got 4 doses albumin.   - Short term intermittent dialysis for now.  Wife okay with temporary HD.  I think pt needs HD sooner rather than later. I discussed with Dr. Lowell Guitar. Plan for HD catheter today. Anticipate HD in next 24 hrs.   GASTROINTESTINAL A:   Emesis last night with high residual P:   IV Pepcid  TFs held Miralax. If no BM, consider abdominal CT  HEMATOLOGIC A:   Acute anemia, s/p 1u PRBC with appropriate response (admission)  Hgb down fron 7.8>>8.4 >>7.4. Likely dilutional P:  Monitor CBC  Transfuse HgB <7  INFECTIOUS A:   Multilobar PNA / Empyema 2/2 to Citrobacter   P:   Cont Meropenem.  Chest tube drainage. May need tpa/pulmozyme Wife does not want heroics, VTS  etc  ENDOCRINE A:   T2DM, controlled P:   Sensitive SSI   NEUROLOGIC A:   H/o MS  Recent CVA (10/17)  Sedated  P:   RASS 0 -  -1  Fentanyl drip 200 to 161mcg/hr Versed pushes as needed   WUA   FAMILY  - Updates: wife updated at bedside 11/21  - Inter-disciplinary family meet or Palliative Care meeting due by: 11/24  Candelaria Stagers, MD.  03/13/16 9:15 AM PGY-2,     ATTENDING NOTE / ATTESTATION NOTE :   I have discussed the case with the resident/APP  Candelaria Stagers MD  I agree with the resident/APP's  history, physical examination, assessment, and plans.    I have edited the above note and modified it according to our agreed history, physical examination, assessment and plan.   I have spent 35 minutes of critical care time with this patient today.  Family :Family updated at length today.  Updated wife at bedside.   Pollie Meyer, MD 03/13/2016, 10:28 AM [place] [facility] Pager [phone] After 3 pm or if no answer, call [phone]

## 2016-03-13 NOTE — Progress Notes (Signed)
Pharmacy Antibiotic Note Duane Harper is a 70 y.o. male admitted on 03/27/16 from OSH with respiratory failure and multifocal  pneumonia.  Pharmacy has been consulted for meropenem dosing. Afebrile and WBC 7.6.   Plan Continue Meropenem 500 mg IV every 12 hours Follow up cultures, renal function, and clinical picture  Height: 5\' 9"  (175.3 cm) Weight: 197 lb 15.6 oz (89.8 kg) IBW/kg (Calculated) : 70.7  Temp (24hrs), Avg:99.9 F (37.7 C), Min:98.9 F (37.2 C), Max:101.2 F (38.4 C)   Recent Labs Lab Mar 27, 2016 2235 03/10/16 0322 03/10/16 0823 03/11/16 0540  03/11/16 1714 03/12/16 0217 03/12/16 0818 03/12/16 1822 03/13/16 0445  WBC 10.7* 11.5*  --  10.3  --   --  9.2  --   --  7.6  CREATININE 3.66* 4.22*  --  5.62*  < > 6.33* 6.58* 7.07* 7.35* 7.82*  LATICACIDVEN  --   --  1.1  --   --   --   --   --   --   --   < > = values in this interval not displayed.  Estimated Creatinine Clearance: 9.9 mL/min (by C-G formula based on SCr of 7.82 mg/dL (H)).    No Known Allergies  11/17 meropenem >>  11/17 vancomycin >> 11/20  11/17 BCx: ngtd 11/17 UCx:  ngtd 11/17 Sputum:  ngtd 11/17 MRSA PCR: negative  11/17 strep pna antigen: neg 11/17 Legionella antigen: neg 11/18 pleural fluid: ngtd  11/15 bronch (OSH): few GPC, rare GNR 11/15 AFB (OSH): neg 11/6 pleural fluid: citrobacter sens to cefepime and merrem  Thank you for allowing pharmacy to be a part of this patient's care.  York Cerise, PharmD Pharmacy Resident  Pager [phone] 03/13/16 10:35 AM

## 2016-03-13 NOTE — Care Management Note (Signed)
Case Management Note  Patient Details  Name: CANDACE DEITERS MRN: [id number] Date of Birth: [birth year]  Subjective/Objective:       Pt admitted with resp failure from [facility]           Action/Plan:  PTA from home with wife.  Pt is now intubated.  CM will continue to follow for discharge needs   Expected Discharge Date:                  Expected Discharge Plan:     In-House Referral:     Discharge planning Services  CM Consult  Post Acute Care Choice:    Choice offered to:     DME Arranged:    DME Agency:     HH Arranged:    HH Agency:     Status of Service:  In process, will continue to follow  If discussed at Long Length of Stay Meetings, dates discussed:    Additional Comments: Plan is to initiate IHD.  CM will continue to follow for discharge needs Cherylann Parr, RN 03/13/2016, 3:31 PM

## 2016-03-13 NOTE — Progress Notes (Signed)
Previous note is on the wrong patient

## 2016-03-13 NOTE — Progress Notes (Signed)
Assessment:  1 AKI, hemodynamically mediated. Oliguric  (creat 1.37 on 11/01/15) 2 Intravascular volume depletion 3 CVA 4 VDRF 5 PNA/empyema/CT drainage Plan: 1I spoke to wife who is amenable to IHD if needed.   Will watch for hard indications over the next couple of days  BP more stable  Objective: Vital signs in last 24 hours: Temp:  [98.9 F (37.2 C)-101.2 F (38.4 C)] 99.3 F (37.4 C) (11/21 0900) Pulse Rate:  [68-109] 93 (11/21 0900) Resp:  [21-37] 35 (11/21 0900) BP: (94-141)/(47-84) 137/64 (11/21 0900) SpO2:  [92 %-100 %] 94 % (11/21 0900) FiO2 (%):  [40 %] 40 % (11/21 0750) Weight:  [89.8 kg (197 lb 15.6 oz)] 89.8 kg (197 lb 15.6 oz) (11/21 0426) Weight change: 0.6 kg (1 lb 5.2 oz)  Intake/Output from previous day: 11/20 0701 - 11/21 0700 In: 2032.7 [I.V.:912.7; NG/GT:270; IV Piggyback:850] Out: 1385 [Urine:40; Chest Tube:570] Intake/Output this shift: Total I/O In: 25.5 [I.V.:25.5] Out: 55 [Urine:5; Emesis/NG output:50]  unchanged exam  Lab Results:  Recent Labs  03/12/16 0217 03/13/16 0445  WBC 9.2 7.6  HGB 8.4* 7.4*  HCT 25.4* 22.5*  PLT 198 179   BMET:  Recent Labs  03/12/16 1822 03/13/16 0445  NA 135 136  K 4.2 4.0  CL 102 102  CO2 23 22  GLUCOSE 109* 88  BUN 66* 70*  CREATININE 7.35* 7.82*  CALCIUM 7.8* 8.0*   No results for input(s): PTH in the last 72 hours. Iron Studies: No results for input(s): IRON, TIBC, TRANSFERRIN, FERRITIN in the last 72 hours. Studies/Results: Dg Chest Port 1 View  Result Date: 03/13/2016 CLINICAL DATA:  Hypoxia EXAM: PORTABLE CHEST 1 VIEW COMPARISON:  March 12, 2016 FINDINGS: Endotracheal tube tip is 4.3 cm above the carina. Central catheter tip is in the left innominate vein. Nasogastric tube tip and side port are below the diaphragm. There is a chest tube on each side, unchanged in position. There is a persistent small right pneumothorax without tension component. There is also a stable small left  apical pneumothorax without tension. There is airspace consolidation in both upper lobes, stable. There is also patchy airspace opacity in the left base. Lungs elsewhere clear. Heart is upper normal in size pulmonary vascularity within normal limits. No adenopathy. There is atherosclerotic calcification in the aorta. IMPRESSION: Tube and catheter positions are unchanged. Small apical pneumothorax on each side persist. No tension component. Airspace consolidation in both upper lobes and left base are stable. No new opacity. Stable cardiac silhouette. There is aortic atherosclerosis. Electronically Signed   By: Lowella Grip III M.D.   On: 03/13/2016 07:11   Dg Chest Port 1 View  Result Date: 03/12/2016 CLINICAL DATA:  Right-sided chest tube, acute respiratory failure EXAM: PORTABLE CHEST 1 VIEW COMPARISON:  Portable chest x-ray of March 12, 2016 FINDINGS: The patient is rotated toward the left. The lungs are reasonably well inflated. There are confluent alveolar opacities in the upper lobes. Patchy densities are present at both lower lobes. A faint pleural line persists in the right apex. The right-sided chest tube tip lies just above the posterior aspect of the eighth rib and is stable. The left-sided chest tube lies at the base of the pleural space with the tip in the overlying the medial cardio phrenic angle. No left-sided pneumothorax is clearly evident. The cardiac silhouette is normal in size. The pulmonary vascularity is indistinct. The endotracheal tube tip lies 5.3 cm above the carina. The esophagogastric tube tip projects below the  inferior margin of the image. The left internal jugular venous catheter tip projects at the junction of the internal jugular vein with the subclavian vein on the left. IMPRESSION: Fairly stable appearance of the chest. Small right apical pneumothorax. Persistent bilateral lateral upper lobe infiltrates with mild bibasilar atelectasis or infiltrate. The support tubes  are in reasonable position. Electronically Signed   By: David  Martinique M.D.   On: 03/12/2016 10:18   Dg Chest Port 1 View  Result Date: 03/12/2016 CLINICAL DATA:  Pneumonia, acute respiratory failure, intubated patient. Current smoker EXAM: PORTABLE CHEST 1 VIEW COMPARISON:  Portable chest x-ray of March 11, 2016 FINDINGS: The lungs are adequately inflated. The interstitial and alveolar infiltrates persist but are not significantly changed. There is partial obscuration of the left hemidiaphragm. Bilateral chest tubes are in stable position. A tiny less than 5% apical pneumothorax on the right cannot be excluded. The heart is normal in size. The central pulmonary vascularity is prominent. The endotracheal tube tip lies 3.9 cm above the carina. The esophagogastric tube tip in proximal port project below the inferior margin of the image. The left internal jugular venous catheter tip projects over the proximal SVC. IMPRESSION: Stable bilateral interstitial and alveolar opacities compatible with pneumonia. No definite CHF. Possible 5% or less apical pneumothorax on the right. The chest tubes are in stable position. Electronically Signed   By: David  Martinique M.D.   On: 03/12/2016 07:10   Dg Chest Port 1 View  Result Date: 03/11/2016 CLINICAL DATA:  70 year old male with a history of a pneumonia and subsequent empyema. Thoracostomy tubes 03/10/2016. EXAM: PORTABLE CHEST 1 VIEW COMPARISON:  03/10/2016, chest CT 03/10/2016 FINDINGS: Cardiomediastinal silhouette unchanged with double density overlying the right greater than left hilar region. Endotracheal tube relatively unchanged, terminating approximately 4.9 cm above the carina. Unchanged left IJ approach central venous catheter which terminates near the confluence of the SVC and left brachiocephalic vein. Unchanged gastric tube terminating out of the field of view. Overlying EKG leads. Unchanged position of bilateral large bore thoracostomy tubes terminating  near the midline. Bilateral mixed interstitial and airspace opacities, without significant change from the comparison plain film. Blunting of the right cardiophrenic angle, left cardiophrenic angle, and the left costophrenic angle. No visualized pneumothorax. IMPRESSION: Mixed bilateral interstitial and airspace opacity, without significant change from the comparison. Unchanged bilateral thoracostomy tubes. No significant residual pleural effusion. Unchanged endotracheal tube, left IJ central venous catheter, and gastric tube. Signed, Dulcy Fanny. Earleen Newport, DO [facility] Electronically Signed   By: Corrie Mckusick D.O.   On: 03/11/2016 14:17    Scheduled: . albumin human  12.5 g Intravenous Q6H  . chlorhexidine gluconate (MEDLINE KIT)  15 mL Mouth Rinse BID  . famotidine  20 mg Per Tube QHS  . heparin subcutaneous  5,000 Units Subcutaneous Q8H  . insulin aspart  1-3 Units Subcutaneous Q4H  . ipratropium-albuterol  3 mL Nebulization Q6H  . mouth rinse  15 mL Mouth Rinse 10 times per day  . meropenem (MERREM) IV  500 mg Intravenous Q12H  . polyethylene glycol  17 g Oral Daily     LOS: 4 days   Pratt Bress C 03/13/2016,10:18 AM

## 2016-03-14 ENCOUNTER — Inpatient Hospital Stay (HOSPITAL_COMMUNITY): Payer: PPO

## 2016-03-14 LAB — GLUCOSE, CAPILLARY
GLUCOSE-CAPILLARY: 100 mg/dL — AB (ref 65–99)
GLUCOSE-CAPILLARY: 103 mg/dL — AB (ref 65–99)
GLUCOSE-CAPILLARY: 94 mg/dL (ref 65–99)
GLUCOSE-CAPILLARY: 99 mg/dL (ref 65–99)
Glucose-Capillary: 92 mg/dL (ref 65–99)
Glucose-Capillary: 102 mg/dL — ABNORMAL HIGH (ref 65–99)

## 2016-03-14 LAB — CULTURE, BLOOD (ROUTINE X 2)
CULTURE: NO GROWTH
CULTURE: NO GROWTH

## 2016-03-14 LAB — BASIC METABOLIC PANEL
ANION GAP: 13 (ref 5–15)
BUN: 80 mg/dL — AB (ref 6–20)
CO2: 21 mmol/L — AB (ref 22–32)
Calcium: 8.4 mg/dL — ABNORMAL LOW (ref 8.9–10.3)
Chloride: 102 mmol/L (ref 101–111)
Creatinine, Ser: 8.98 mg/dL — ABNORMAL HIGH (ref 0.61–1.24)
GFR calc Af Amer: 6 mL/min — ABNORMAL LOW (ref >60)
GFR, EST NON AFRICAN AMERICAN: 5 mL/min — AB (ref >60)
GLUCOSE: 93 mg/dL (ref 65–99)
POTASSIUM: 5.1 mmol/L (ref 3.5–5.1)
Sodium: 136 mmol/L (ref 135–145)

## 2016-03-14 LAB — RENAL FUNCTION PANEL
ANION GAP: 13 (ref 5–15)
Albumin: 1.8 g/dL — ABNORMAL LOW (ref 3.5–5.0)
BUN: 89 mg/dL — ABNORMAL HIGH (ref 6–20)
CHLORIDE: 103 mmol/L (ref 101–111)
CO2: 21 mmol/L — AB (ref 22–32)
Calcium: 8.4 mg/dL — ABNORMAL LOW (ref 8.9–10.3)
Creatinine, Ser: 9.7 mg/dL — ABNORMAL HIGH (ref 0.61–1.24)
GFR calc non Af Amer: 5 mL/min — ABNORMAL LOW (ref >60)
GFR, EST AFRICAN AMERICAN: 6 mL/min — AB (ref >60)
Glucose, Bld: 98 mg/dL (ref 65–99)
Phosphorus: 10.4 mg/dL — ABNORMAL HIGH (ref 2.5–4.6)
Potassium: 5.5 mmol/L — ABNORMAL HIGH (ref 3.5–5.1)
Sodium: 137 mmol/L (ref 135–145)

## 2016-03-14 LAB — ALT: ALT: 28 U/L (ref 17–63)

## 2016-03-14 LAB — CBC
HCT: 23.5 % — ABNORMAL LOW (ref 39.0–52.0)
HEMOGLOBIN: 7.7 g/dL — AB (ref 13.0–17.0)
MCH: 27.3 pg (ref 26.0–34.0)
MCHC: 32.8 g/dL (ref 30.0–36.0)
MCV: 83.3 fL (ref 78.0–100.0)
Platelets: 200 10*3/uL (ref 150–400)
RBC: 2.82 MIL/uL — AB (ref 4.22–5.81)
RDW: 15.8 % — ABNORMAL HIGH (ref 11.5–15.5)
WBC: 8 10*3/uL (ref 4.0–10.5)

## 2016-03-14 LAB — PHOSPHORUS: Phosphorus: 9.2 mg/dL — ABNORMAL HIGH (ref 2.5–4.6)

## 2016-03-14 LAB — MAGNESIUM: Magnesium: 2.4 mg/dL (ref 1.7–2.4)

## 2016-03-14 MED ORDER — SODIUM CHLORIDE 0.9 % IV SOLN: 100.0000 mL | INTRAVENOUS | Status: DC | PRN

## 2016-03-14 MED ORDER — PENTAFLUOROPROP-TETRAFLUOROETH EX AERO: 1.0000 "application " | INHALATION_SPRAY | CUTANEOUS | Status: DC | PRN

## 2016-03-14 MED ORDER — ALTEPLASE 2 MG IJ SOLR
2.0000 mg | Freq: Once | INTRAMUSCULAR | Status: DC | PRN
  Filled 2016-03-14: qty 2

## 2016-03-14 MED ORDER — HEPARIN SODIUM (PORCINE) 1000 UNIT/ML DIALYSIS: 20.0000 [IU]/kg | INTRAMUSCULAR | Status: DC | PRN

## 2016-03-14 MED ORDER — POLYETHYLENE GLYCOL 3350 17 G PO PACK
17.0000 g | PACK | Freq: Two times a day (BID) | ORAL | Status: DC
  Administered 2016-03-14 – 2016-03-19 (×9): 17 g via ORAL
  Filled 2016-03-14 (×12): qty 1

## 2016-03-14 MED ORDER — HEPARIN SODIUM (PORCINE) 1000 UNIT/ML DIALYSIS: 1000.0000 [IU] | INTRAMUSCULAR | Status: DC | PRN

## 2016-03-14 MED ORDER — LIDOCAINE HCL (PF) 1 % IJ SOLN: 5.0000 mL | INTRAMUSCULAR | Status: DC | PRN

## 2016-03-14 MED ORDER — SODIUM CHLORIDE 0.9 % IV SOLN
500.0000 mg | Freq: Every day | INTRAVENOUS | Status: DC
  Administered 2016-03-15 – 2016-03-19 (×5): 500 mg via INTRAVENOUS
  Filled 2016-03-14 (×5): qty 0.5

## 2016-03-14 MED ORDER — WHITE PETROLATUM GEL
Status: AC
  Administered 2016-03-14: 10:00:00
  Filled 2016-03-14: qty 1

## 2016-03-14 MED ORDER — LIDOCAINE-PRILOCAINE 2.5-2.5 % EX CREA
1.0000 "application " | TOPICAL_CREAM | CUTANEOUS | Status: DC | PRN
  Filled 2016-03-14: qty 5

## 2016-03-14 MED ORDER — VITAL AF 1.2 CAL PO LIQD
1000.0000 mL | ORAL | Status: DC
  Administered 2016-03-14 – 2016-03-19 (×6): 1000 mL

## 2016-03-14 MED ORDER — ACETAMINOPHEN 325 MG PO TABS
650.0000 mg | ORAL_TABLET | ORAL | Status: DC | PRN
  Administered 2016-03-15 – 2016-03-17 (×3): 650 mg
  Filled 2016-03-14 (×3): qty 2

## 2016-03-14 NOTE — Progress Notes (Signed)
Assessment:  1 AKI, hemodynamically mediated. Oliguric  (creat 1.37 on 11/01/15) 2 Intravascular volume depletion 3 CVA 4 VDRF 5 PNA/empyema/CT drainage Plan: Will proceed with HD today after HD cath placement  Subjective: Interval History: oliguric  Objective: Vital signs in last 24 hours: Temp:  [99.2 F (37.3 C)-100 F (37.8 C)] 99.3 F (37.4 C) (11/22 0432) Pulse Rate:  [71-104] 88 (11/22 0600) Resp:  [10-37] 33 (11/22 0600) BP: (107-149)/(52-74) 129/57 (11/22 0600) SpO2:  [88 %-100 %] 99 % (11/22 0727) FiO2 (%):  [40 %-60 %] 50 % (11/22 0727) Weight:  [89 kg (196 lb 3.4 oz)] 89 kg (196 lb 3.4 oz) (11/22 0351) Weight change: -0.8 kg (-1 lb 12.2 oz)  Intake/Output from previous day: 11/21 0701 - 11/22 0700 In: 523 [I.V.:363; NG/GT:60; IV Piggyback:100] Out: 915 [Urine:75; Emesis/NG output:50; Chest Tube:790] Intake/Output this shift: No intake/output data recorded.  Neck: no adenopathy, no carotid bruit, no JVD, supple, symmetrical, trachea midline, thyroid not enlarged, symmetric, no tenderness/mass/nodules and bulging neck veins  Sedated  Lab Results:  Recent Labs  03/12/16 0217 03/13/16 0445  WBC 9.2 7.6  HGB 8.4* 7.4*  HCT 25.4* 22.5*  PLT 198 179   BMET:  Recent Labs  03/13/16 0445 03/14/16 0344  NA 136 136  K 4.0 5.1  CL 102 102  CO2 22 21*  GLUCOSE 88 93  BUN 70* 80*  CREATININE 7.82* 8.98*  CALCIUM 8.0* 8.4*   No results for input(s): PTH in the last 72 hours. Iron Studies: No results for input(s): IRON, TIBC, TRANSFERRIN, FERRITIN in the last 72 hours. Studies/Results: Dg Chest Port 1 View  Result Date: 03/13/2016 CLINICAL DATA:  Possible pneumothorax EXAM: PORTABLE CHEST 1 VIEW COMPARISON:  Chest x-ray of 03/13/2016 FINDINGS: There are small bilateral pneumothoraces present better seen over the apices. Bilateral chest tubes remain. Parenchymal opacities remain in the upper lobes. Endotracheal tube appears to be in good position  approximately 5.1 cm above the carina. Left IJ central venous line tip overlies the left innominate vein. Heart size is stable. Coarse lung markings are noted at the left lung base. IMPRESSION: 1. Little change in small bilateral primarily apical pneumothoraces with bilateral chest tubes remaining. 2. No change in primarily upper lobe parenchymal opacities bilaterally. 3. Endotracheal tube tip 5.1 cm above the carina Electronically Signed   By: Ivar Drape M.D.   On: 03/13/2016 15:14   Dg Chest Port 1 View  Result Date: 03/13/2016 CLINICAL DATA:  Hypoxia EXAM: PORTABLE CHEST 1 VIEW COMPARISON:  March 12, 2016 FINDINGS: Endotracheal tube tip is 4.3 cm above the carina. Central catheter tip is in the left innominate vein. Nasogastric tube tip and side port are below the diaphragm. There is a chest tube on each side, unchanged in position. There is a persistent small right pneumothorax without tension component. There is also a stable small left apical pneumothorax without tension. There is airspace consolidation in both upper lobes, stable. There is also patchy airspace opacity in the left base. Lungs elsewhere clear. Heart is upper normal in size pulmonary vascularity within normal limits. No adenopathy. There is atherosclerotic calcification in the aorta. IMPRESSION: Tube and catheter positions are unchanged. Small apical pneumothorax on each side persist. No tension component. Airspace consolidation in both upper lobes and left base are stable. No new opacity. Stable cardiac silhouette. There is aortic atherosclerosis. Electronically Signed   By: Lowella Grip III M.D.   On: 03/13/2016 07:11   Dg Chest [facility]  Result Date: 03/12/2016 CLINICAL DATA:  Right-sided chest tube, acute respiratory failure EXAM: PORTABLE CHEST 1 VIEW COMPARISON:  Portable chest x-ray of March 12, 2016 FINDINGS: The patient is rotated toward the left. The lungs are reasonably well inflated. There are confluent  alveolar opacities in the upper lobes. Patchy densities are present at both lower lobes. A faint pleural line persists in the right apex. The right-sided chest tube tip lies just above the posterior aspect of the eighth rib and is stable. The left-sided chest tube lies at the base of the pleural space with the tip in the overlying the medial cardio phrenic angle. No left-sided pneumothorax is clearly evident. The cardiac silhouette is normal in size. The pulmonary vascularity is indistinct. The endotracheal tube tip lies 5.3 cm above the carina. The esophagogastric tube tip projects below the inferior margin of the image. The left internal jugular venous catheter tip projects at the junction of the internal jugular vein with the subclavian vein on the left. IMPRESSION: Fairly stable appearance of the chest. Small right apical pneumothorax. Persistent bilateral lateral upper lobe infiltrates with mild bibasilar atelectasis or infiltrate. The support tubes are in reasonable position. Electronically Signed   By: David  Martinique M.D.   On: 03/12/2016 10:18    Scheduled: . chlorhexidine gluconate (MEDLINE KIT)  15 mL Mouth Rinse BID  . famotidine  20 mg Per Tube QHS  . heparin subcutaneous  5,000 Units Subcutaneous Q8H  . insulin aspart  1-3 Units Subcutaneous Q4H  . ipratropium-albuterol  3 mL Nebulization Q6H  . mouth rinse  15 mL Mouth Rinse 10 times per day  . meropenem (MERREM) IV  500 mg Intravenous Q12H  . polyethylene glycol  17 g Oral BID     LOS: 5 days   Jahmarion Popoff C 03/14/2016,7:30 AM

## 2016-03-14 NOTE — Progress Notes (Signed)
Pt's temp 101.5 and Joneen Roach NP made aware.

## 2016-03-14 NOTE — Progress Notes (Addendum)
Pharmacy Antibiotic Note Duane Harper is a 70 y.o. male admitted on 03/08/2016 from OSH with respiratory failure and multifocal  pneumonia.  Patient with bilateral empyema with continued drainage in chest tubes. Pharmacy has been consulted for meropenem dosing. Afebrile (99.4) and WBC 7.6. Renal function has decreased and patient is being considered for HD.   Plan Change Meropenem 500 mg IV q12hr to Meropenem 500 mg q24hr.  Follow up cultures, renal function, and clinical picture Follow up HD plan   Height: 5\' 9"  (175.3 cm) Weight: 196 lb 3.4 oz (89 kg) IBW/kg (Calculated) : 70.7  Temp (24hrs), Avg:99.5 F (37.5 C), Min:99.2 F (37.3 C), Max:100 F (37.8 C)   Recent Labs Lab 03/22/2016 2235 03/10/16 0322 03/10/16 0823 03/11/16 0540  03/12/16 0217 03/12/16 0818 03/12/16 1822 03/13/16 0445 03/14/16 0344  WBC 10.7* 11.5*  --  10.3  --  9.2  --   --  7.6  --   CREATININE 3.66* 4.22*  --  5.62*  < > 6.58* 7.07* 7.35* 7.82* 8.98*  LATICACIDVEN  --   --  1.1  --   --   --   --   --   --   --   < > = values in this interval not displayed.  Estimated Creatinine Clearance: 8.6 mL/min (by C-G formula based on SCr of 8.98 mg/dL (H)).    No Known Allergies  11/2 Zosyn at OSH >> 11/11 11/8 Levaquin at OSH >> 11/15 11/12 Cefepime at OSH >> 11/16 11/17 meropenem >>  11/17 vancomycin >> 11/20  11/17 BCx: ngtd 11/17 UCx:  ngtd 11/17 Sputum:  ngtd 11/17 MRSA PCR: negative  11/17 strep pna antigen: neg 11/17 Legionella antigen: neg 11/18 pleural fluid: ngtd  11/15 bronch (OSH): few GPC, rare GNR 11/15 AFB (OSH): neg 11/6 pleural fluid (OSH): citrobacter sens to cefepime and merrem  Thank you for allowing pharmacy to be a part of this patient's care.  York Cerise, PharmD Pharmacy Resident  Pager [phone] 03/14/16 10:54 AM

## 2016-03-14 NOTE — Progress Notes (Signed)
Asked to evaluate patient by RN for fevers 101.5  Will order tylenol PRN  Also has been 5 days since culture, will pan culture.   Joneen Roach, AGACNP-BC [facility] Pulmonology/Critical Care Pager [phone] or [phone]  03/14/2016 11:44 PM

## 2016-03-14 NOTE — Progress Notes (Signed)
Nutrition Follow-up  DOCUMENTATION CODES:   Not applicable  INTERVENTION:    Resume TF via OGT with Vital AF 1.2 at 35 ml/h, increase by 10 ml every 4 hours to goal rate of 75 ml/h to provide 2160 kcal, 135 gm protein, 1460 ml free water daily.  NUTRITION DIAGNOSIS:   Inadequate oral intake related to inability to eat as evidenced by NPO status.  Ongoing  GOAL:   Patient will meet greater than or equal to 90% of their needs  Unmet with TF off  MONITOR:   Diet advancement, Vent status, Labs, I & O's  REASON FOR ASSESSMENT:   Consult Enteral/tube feeding initiation and management  ASSESSMENT:   70 y/o PMHx HLD, DM, MS, CVA (01/31/16), HTN, CKD.  Pt had presented to OSH 11/1 with PNA and bilateral pleural effusions. Intubated 11/14. Transferred to [facility].   Patient remains intubated on ventilator support. Temp (24hrs), Avg:99.5 F (37.5 C), Min:99.2 F (37.3 C), Max:100 F (37.8 C) Discussed patient with RN today. TF has been on hold due to vomiting. Plans to resume TF today. Will start at 35 ml/h and increase slowly to goal rate of 75 ml/h. Labs and medications reviewed. Phosphorus elevated at 9.2.  Diet Order:  Diet NPO time specified  Skin:  Reviewed, no issues  Last BM:  Unknown  Height:   Ht Readings from Last 1 Encounters:  03/11/16 5\' 9"  (1.753 m)    Weight:   Wt Readings from Last 1 Encounters:  03/14/16 196 lb 3.4 oz (89 kg)    Ideal Body Weight:  72.73 kg  BMI:  Body mass index is 28.98 kg/m.  Estimated Nutritional Needs:   Kcal:  2199  Protein:  120-135 gm  Fluid:  2.2 L  EDUCATION NEEDS:   No education needs identified at this time  Joaquin Courts, RD, LDN, CNSC Pager [phone] After Hours Pager [phone]

## 2016-03-14 NOTE — Procedures (Signed)
Hemodialysis Insertion Procedure Note JEMARION OGBURN [id number] [birth year]  Procedure: Insertion of Hemodialysis Catheter Type: 3 port  Indications: Hemodialysis   Procedure Details Consent: Risks of procedure as well as the alternatives and risks of each were explained to the (patient/caregiver).  Consent for procedure obtained. Time Out: Verified patient identification, verified procedure, site/side was marked, verified correct patient position, special equipment/implants available, medications/allergies/relevent history reviewed, required imaging and test results available.  Performed  Maximum sterile technique was used including antiseptics, cap, gloves, gown, hand hygiene, mask and sheet. Skin prep: Chlorhexidine; local anesthetic administered A antimicrobial bonded/coated triple lumen catheter was placed in the right internal jugular vein using the Seldinger technique. Ultrasound guidance used.Yes.   Catheter placed to 16 cm. Blood aspirated via all 3 ports and then flushed x 3. Line sutured x 2 and dressing applied.  Evaluation Blood flow good Complications: No apparent complications Patient did tolerate procedure well. Chest X-ray ordered to verify placement.  CXR: pending.  Brett Canales Katee Wentland ACNP [facility] PCCM Pager [phone] till 3 pm If no answer page [phone] 03/14/2016, 12:04 PM

## 2016-03-14 NOTE — Progress Notes (Signed)
PULMONARY / CRITICAL CARE MEDICINE   Name: Duane Harper MRN: [id number] DOB: 1945/07/19    ADMISSION DATE:  Mar 16, 2016  REFERRING MD:  [facility]   CHIEF COMPLAINT:  Respiratory Failure   Brief:  History obtained from wife and records review as patient is sedated/intubated.   Duane Harper is 70 y.o. 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 Had left sided deficits from this CVA as well as cognitive changes. While undergoing rehab for his CVA, he developed dyspnea. Was diagnosed with pneumonia and bilateral pleural effusions. Patient was started on Levaquin and required supplemental O2. Patient's pleural fluid isolated Citrobacter. Fluid analysis suggested transudate process. Patient was treated with Lasix and antibiotics were broadened.  On 11/13, patient had a syncopal episode while participating in PT. He was found to be hypotensive at that time. Patient continued to remain febrile with worsening hypoxia despite supplemental O2 and abx coverage. CTA chest obtained which was negative for PE and slight progression of severe multilobar PNA compared to prior CT.   On 11/14, patient stated he desired intubation as his WOB was significant on BiPAP. Bronchoscopy was performed which showed no endobronchial lesions and minimal inflammation of the bronchi in the left upper lobe. Bronchial cultures showed normal oral respiratory flora.   Had discussion with wife regarding patient's wishes. She reports that he would not want to remain on ventilator for long term. He would not wish to have a tracheostomy or a PEG tube. His wish would be to be a DNR. He would not want chest compressions or defibrillation.   Imaging Dg Chest Port 1 View 11/20:  Fairly stable appearance of the chest. Small right apical pneumothorax. Persistent bilateral lateral upper lobe infiltrates with mild bibasilar atelectasis or infiltrate. The support tubes are in reasonable position.  11/21:  Tube and catheter positions are unchanged. Small apical pneumothorax on each side persist. No tension component. Airspace consolidation in both upper lobes and left base are stable. No new opacity. Stable cardiac silhouette. There is aortic atherosclerosis.   11/22: Persistent small pneumothoraces with bilateral upper lobe infiltrates stable from the prior exam.   STUDIES:  CT chest 11/18>>>Extensive bilateral pulmonary opacities worrisome for an inflammatory process, ARDS, or edema are stable. Malignancy is not excluded.Moderate bilateral pleural effusions have increased.  CULTURES: 11/6 pleural fluid L with Citrobacter sensitive to Cefepime, Meropenem. Resistant to cefazolin 11/18 rt effusion>>>NGTD 11/18 left effusion>>>NGTD 11/17 BC>>>NGTD 11/17 sputum>>>Normal flora  ANTIBIOTICS: Vancomycin 11/17 >> 11/20 Meropenem 11/17 >>   LINES/TUBES: ETT 11/14 >>  Right Femoral 11/17 >>11/18 Left IJ 11/18>>> Rt chest tuibe 11/18>>> Left chest tube 11/18>>> Foley 11/15>>  SIGNIFICANT EVENTS: 11/1: Admitted to [facility] for hypoxia and dyspnea, found to have multilobar PNA and pleural effusions  11/6: Thoracentesis performed; culture grew Citrobacter  11/13: CTA chest negative for PE  11/4: Patient intubated  11/17: Patient started on Levophed, transferred to [facility]  11/18- Chest tubes placed, came off pressors, line neck placed, fem dc'ed 11/20 - AKI. Nephro consulted 11/21- Anuria with worsening renal function  SUBJECTIVE:  Didn't get HD cath overnight. Opens eyes but doesn't follow command. Calm on vent. Anuric.   VITAL SIGNS: BP (!) 129/57   Pulse 88   Temp 99.4 F (37.4 C) (Axillary)   Resp (!) 33   Ht 5\' 9"  (1.753 m)   Wt 89 kg (196 lb 3.4 oz)   SpO2 99%   BMI 28.98 kg/m  HEMODYNAMICS: CVP:  [6 mmHg-10 mmHg] 7 mmHg  VENTILATOR SETTINGS: Vent Mode: PRVC FiO2 (%):  [40 %-60 %] 50 % Set Rate:  [35 bmp] 35 bmp Vt Set:  [500 mL] 500 mL PEEP:  [5  cmH20-10 cmH20] 10 cmH20 Plateau Pressure:  [22 cmH20-27 cmH20] 25 cmH20  INTAKE / OUTPUT: I/O last 3 completed shifts: In: 1433 [I.V.:723; NG/GT:60; IV Piggyback:650] Out: 1215 [Urine:115; Emesis/NG output:50; Chest Tube:1050]  PHYSICAL EXAMINATION: General: sick, opens eyes when addressed by name Neuro:  Opens eyes in response to sounds, doesn't follow command, RASS -1 HEENT:  ETT in place,  synchronous with vent Cardiovascular:  RRR s1 s2  Lungs:  Coarse breath sounds bilaterally, no wheeze or crackles Abdomen:  Soft, non-distended , no r/g Musculoskeletal:  Mild edema Skin:  No rashes on exposed skin.   LABS:  BMET  Recent Labs Lab 03/12/16 1822 03/13/16 0445 03/14/16 0344  NA 135 136 136  K 4.2 4.0 5.1  CL 102 102 102  CO2 23 22 21*  BUN 66* 70* 80*  CREATININE 7.35* 7.82* 8.98*  GLUCOSE 109* 88 93    Electrolytes  Recent Labs Lab 03/12/16 1822 03/13/16 0445 03/14/16 0344  CALCIUM 7.8* 8.0* 8.4*  MG 2.1 2.1 2.4  PHOS 6.7* 6.9* 9.2*    CBC  Recent Labs Lab 03/11/16 0540 03/12/16 0217 03/13/16 0445  WBC 10.3 9.2 7.6  HGB 8.7* 8.4* 7.4*  HCT 25.9* 25.4* 22.5*  PLT 203 198 179    Coag's  Recent Labs Lab 03/10/16 0823  INR 1.23    Sepsis Markers  Recent Labs Lab 2015-08-06 2235 03/10/16 0823  LATICACIDVEN  --  1.1  PROCALCITON 2.24  --     ABG  Recent Labs Lab 03/11/16 0900 03/11/16 1035 03/14/16 0702  PHART 7.381 7.318* 7.349*  PCO2ART 32.4 42.0 38.7  PO2ART 92.0 91.3 113*    Liver Enzymes  Recent Labs Lab 03/10/16 0936 03/11/16 0540 03/12/16 0217  AST 27  --  32  ALT 20  --  22  ALKPHOS 115  --  153*  BILITOT 0.8  --  0.7  ALBUMIN 1.5* 1.3* 1.3*    Cardiac Enzymes  Recent Labs Lab 2015-08-06 2235 03/10/16 0322 03/10/16 0936  TROPONINI <0.03 <0.03 <0.03    Glucose  Recent Labs Lab 03/13/16 1127 03/13/16 1601 03/13/16 2018 03/14/16 0017 03/14/16 0431 03/14/16 0747  GLUCAP 98 94 97 94 99 92      DISCUSSION: Duane Harper is 70 y.o. male with PMH of significant tobacco use, recent CVA, MS, HTN, CKD, and BPH who was [facility] acute respiratory failure requiring intubation in the setting of multilobar PNA and pleural effusions that cultured Citrobacter. Patient was also persistently hypotensive requiring pressor support.   ASSESSMENT / PLAN:  PULMONARY A: Acute Hypoxemic Hypercapneic resp fx 2/2 Empyema, B, with Citrobacter in pleural fluid (11/6) with Multilobar PNA S/P Chest tube R and L, 11/18, with small apical PTX, bilateral.  H/o tobacco abuse, likely COPD  P:   Continue vent. SAT/SBT as able ABG reviewed Chest tubes to water . Drained 790 cc/24hr On meropenem 11/17>> May need lytics/tpa/pulmozyme. Holding off for now  CARDIOVASCULAR A:  H/o HTN  Echo 11/20 with EF of 60-65%, mod LVH and PAPP 51 History of UE DVT. LE doppler 11/20 without DVT Hypotension. Off pressor 11/19 P:  Albumin 12.5 mg q6h by nephro > got 4 doses on 11/20 KVO Levophed as needed for MAP < 65.  Off this since 11/19  RENAL A:   AKI on CKD 2/2 sepsis/hypovolemia Non-anion gap metabolic acidosis: resolved BPH HAoCKD st 3A (Creat 1.3 in 10/2015) Anuria. UOP 75cc/+-0.4L/+7L P:   Off lasix. 7L (+). Appreciate renal recs HD cath today.  Plan for HD today.  Wife okay with temporary HD.   GASTROINTESTINAL A:   Held TF due to emesis P:   IV Pepcid  Resume TFs Stilll no BM. Increase Miralax to BID  HEMATOLOGIC A:   Acute anemia, s/p 1u PRBC with appropriate response (admission)  Hgb stable P:  Monitor CBC  Transfuse HgB <7  INFECTIOUS A:   Multilobar PNA / Empyema 2/2 to Citrobacter   P:   Cont Meropenem.  Chest tube drainage. May need tpa/pulmozyme Wife does not want heroics, VTS etc  ENDOCRINE A:   T2DM, controlled P:   Sensitive SSI   NEUROLOGIC A:   H/o MS  Recent CVA (10/17)  Sedated  P:   RASS 0 to -1  Fentanyl  gtt Versed pushes as needed   WUA   FAMILY  - Updates: wife updated at bedside 11/22  - Inter-disciplinary family meet or Palliative Care meeting due by: 11/24  Candelaria Stagers, MD.  03/14/16 8:15 AM PGY-2,   ATTENDING NOTE / ATTESTATION NOTE :   I have discussed the case with the resident/APP  Candelaria Stagers   I agree with the resident/APP's  history, physical examination, assessment, and plans.    I have edited the above note and modified it according to our agreed history, physical examination, assessment and plan.   I have spent 30 minutes of critical care time with this patient today.  Family : Wife updated at bedside.    Pollie Meyer, MD 03/14/2016, 9:29 AM  Pulmonary and Critical Care Pager [phone] After 3 pm or if no answer, call [phone]

## 2016-03-15 ENCOUNTER — Inpatient Hospital Stay (HOSPITAL_COMMUNITY): Payer: PPO

## 2016-03-15 LAB — CBC
HCT: 23.8 % — ABNORMAL LOW (ref 39.0–52.0)
Hemoglobin: 7.9 g/dL — ABNORMAL LOW (ref 13.0–17.0)
MCH: 27.5 pg (ref 26.0–34.0)
MCHC: 33.2 g/dL (ref 30.0–36.0)
MCV: 82.9 fL (ref 78.0–100.0)
PLATELETS: 193 10*3/uL (ref 150–400)
RBC: 2.87 MIL/uL — AB (ref 4.22–5.81)
RDW: 15.9 % — ABNORMAL HIGH (ref 11.5–15.5)
WBC: 7.6 10*3/uL (ref 4.0–10.5)

## 2016-03-15 LAB — BLOOD GAS, ARTERIAL
Acid-base deficit: 1.9 mmol/L (ref 0.0–2.0)
BICARBONATE: 22.3 mmol/L (ref 20.0–28.0)
DRAWN BY: 406621
FIO2: 50
O2 Saturation: 97.4 %
PATIENT TEMPERATURE: 98.6
PCO2 ART: 37.4 mmHg (ref 32.0–48.0)
PEEP/CPAP: 10 cmH2O
PO2 ART: 103 mmHg (ref 83.0–108.0)
Pressure control: 20 cmH2O
RATE: 20 resp/min
pH, Arterial: 7.393 (ref 7.350–7.450)

## 2016-03-15 LAB — HEPATITIS B SURFACE ANTIGEN: HEP B S AG: NEGATIVE

## 2016-03-15 LAB — GLUCOSE, CAPILLARY
GLUCOSE-CAPILLARY: 119 mg/dL — AB (ref 65–99)
GLUCOSE-CAPILLARY: 140 mg/dL — AB (ref 65–99)
GLUCOSE-CAPILLARY: 141 mg/dL — AB (ref 65–99)
GLUCOSE-CAPILLARY: 144 mg/dL — AB (ref 65–99)
Glucose-Capillary: 118 mg/dL — ABNORMAL HIGH (ref 65–99)
Glucose-Capillary: 127 mg/dL — ABNORMAL HIGH (ref 65–99)
Glucose-Capillary: 148 mg/dL — ABNORMAL HIGH (ref 65–99)

## 2016-03-15 LAB — HEPATITIS B SURFACE ANTIBODY,QUALITATIVE: Hep B S Ab: NONREACTIVE

## 2016-03-15 LAB — BASIC METABOLIC PANEL
Anion gap: 11 (ref 5–15)
BUN: 77 mg/dL — AB (ref 6–20)
CO2: 24 mmol/L (ref 22–32)
CREATININE: 8.51 mg/dL — AB (ref 0.61–1.24)
Calcium: 8.2 mg/dL — ABNORMAL LOW (ref 8.9–10.3)
Chloride: 103 mmol/L (ref 101–111)
GFR calc Af Amer: 7 mL/min — ABNORMAL LOW (ref >60)
GFR, EST NON AFRICAN AMERICAN: 6 mL/min — AB (ref >60)
GLUCOSE: 140 mg/dL — AB (ref 65–99)
Potassium: 4.9 mmol/L (ref 3.5–5.1)
SODIUM: 138 mmol/L (ref 135–145)

## 2016-03-15 LAB — HEPATITIS B CORE ANTIBODY, TOTAL: Hep B Core Total Ab: NEGATIVE

## 2016-03-15 LAB — PHOSPHORUS: Phosphorus: 8.5 mg/dL — ABNORMAL HIGH (ref 2.5–4.6)

## 2016-03-15 LAB — PROCALCITONIN: PROCALCITONIN: 1.54 ng/mL

## 2016-03-15 LAB — MAGNESIUM: MAGNESIUM: 2.3 mg/dL (ref 1.7–2.4)

## 2016-03-15 NOTE — Progress Notes (Signed)
Pt disynchronous with vent. CCM NP at bedside making changes to [facility]. Pt tolerating much better and resting comfortably. RT will obtain ABG in 1 hr post changes to ensure it is working. RT will continue to monitor.

## 2016-03-15 NOTE — Progress Notes (Signed)
PULMONARY / CRITICAL CARE MEDICINE   Name: Duane Harper MRN: [id number] DOB: May 12, 1945    ADMISSION DATE:  03/07/2016  REFERRING MD:  Duane Harper   CHIEF COMPLAINT:  Respiratory Failure   Brief:  History obtained from wife and records review as patient is sedated/intubated.   Duane Harper is 70 y.o. 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 Had left sided deficits from this CVA as well as cognitive changes. While undergoing rehab for his CVA, he developed dyspnea. Was diagnosed with pneumonia and bilateral pleural effusions. Patient was started on Levaquin and required supplemental O2. Patient's pleural fluid isolated Citrobacter. Fluid analysis suggested transudate process. Patient was treated with Lasix and antibiotics were broadened.  On 11/13, patient had a syncopal episode while participating in PT. He was found to be hypotensive at that time. Patient continued to remain febrile with worsening hypoxia despite supplemental O2 and abx coverage. CTA chest obtained which was negative for PE and slight progression of severe multilobar PNA compared to prior CT.   On 11/14, patient stated he desired intubation as his WOB was significant on BiPAP. Bronchoscopy was performed which showed no endobronchial lesions and minimal inflammation of the bronchi in the left upper lobe. Bronchial cultures showed normal oral respiratory flora.   Had discussion with wife regarding patient's wishes. She reports that he would not want to remain on ventilator for long term. He would not wish to have a tracheostomy or a PEG tube. His wish would be to be a DNR. He would not want chest compressions or defibrillation.   Imaging Dg Chest Port 1 View 11/20:  Fairly stable appearance of the chest. Small right apical pneumothorax. Persistent bilateral lateral upper lobe infiltrates with mild bibasilar atelectasis or infiltrate. The support tubes are in reasonable position.  11/21:  Tube and catheter positions are unchanged. Small apical pneumothorax on each side persist. No tension component. Airspace consolidation in both upper lobes and left base are stable. No new opacity. Stable cardiac silhouette. There is aortic atherosclerosis.   11/22: Persistent small pneumothoraces with bilateral upper lobe infiltrates stable from the prior exam.   STUDIES:  CT chest 11/18>>>Extensive bilateral pulmonary opacities worrisome for an inflammatory process, ARDS, or edema are stable. Malignancy is not excluded.Moderate bilateral pleural effusions have increased.  CULTURES: 11/6 pleural fluid L with Citrobacter sensitive to Cefepime, Meropenem. Resistant to cefazolin 11/18 rt effusion>>>NGTD 11/18 left effusion>>>NGTD 11/17 BC>>>NGTD 11/17 sputum>>>Normal flora 11/22 sputum > 11/22 blood >  ANTIBIOTICS: Vancomycin 11/17 >> 11/20 Meropenem 11/17 >>   LINES/TUBES: ETT 11/14 >>  Right Femoral 11/17 >>11/18 Left IJ 11/18>>> Rt chest tuibe 11/18>>> Left chest tube 11/18>>> Foley 11/15>>  SIGNIFICANT EVENTS: 11/1: Admitted to [facility] for hypoxia and dyspnea, found to have multilobar PNA and pleural effusions  11/6: Thoracentesis performed; culture grew Citrobacter  11/13: CTA chest negative for PE  11/4: Patient intubated  11/17: Patient started on Levophed, transferred to [facility]  11/18- Chest tubes placed, came off pressors, line neck placed, fem dc'ed 11/20 - AKI. Nephro consulted 11/21- Anuria with worsening renal function  SUBJECTIVE:  Tolerated HD 11/22.  Febrile overnight.  Anuric. Sedated.   VITAL SIGNS: BP (!) 131/58   Pulse 98   Temp (!) 101.1 F (38.4 C) (Oral) Comment: RN, Katrice, Aware   Resp (!) 30   Ht 5\' 9"  (1.753 m)   Wt 87 kg (191 lb 12.8 oz)   SpO2 97%  BMI 28.32 kg/m   HEMODYNAMICS: CVP:  [5 mmHg-7 mmHg] 6 mmHg  VENTILATOR SETTINGS: Vent Mode: PRVC FiO2 (%):  [40 %-50 %] 40 % Set Rate:  [35 bmp] 35 bmp Vt Set:  [500  mL] 500 mL PEEP:  [10 cmH20] 10 cmH20 Pressure Support:  [10 cmH20] 10 cmH20 Plateau Pressure:  [17 cmH20-26 cmH20] 26 cmH20  INTAKE / OUTPUT: I/O last 3 completed shifts: In: 1175.1 [I.V.:557.5; NG/GT:517.6; IV Piggyback:100] Out: 1250 [Urine:150; Chest Tube:1100]  PHYSICAL EXAMINATION: General: sick, opens eyes when addressed by name Neuro:  Opens eyes in response to sounds, doesn't follow command, RASS -1 HEENT:  ETT in place,  synchronous with vent Cardiovascular:  RRR s1 s2  Lungs:  Coarse breath sounds bilaterally, no wheeze or crackles. (-) sub q air. (-) crepitus.  Abdomen:  Soft, non-distended , no r/g Musculoskeletal:  Gr 2 edema Skin:  No rashes on exposed skin.   LABS:  BMET  Recent Labs Lab 03/14/16 0344 03/14/16 1702 03/15/16 0315  NA 136 137 138  K 5.1 5.5* 4.9  CL 102 103 103  CO2 21* 21* 24  BUN 80* 89* 77*  CREATININE 8.98* 9.70* 8.51*  GLUCOSE 93 98 140*    Electrolytes  Recent Labs Lab 03/13/16 0445 03/14/16 0344 03/14/16 1702 03/15/16 0315  CALCIUM 8.0* 8.4* 8.4* 8.2*  MG 2.1 2.4  --  2.3  PHOS 6.9* 9.2* 10.4* 8.5*    CBC  Recent Labs Lab 03/13/16 0445 03/14/16 1702 03/15/16 0315  WBC 7.6 8.0 7.6  HGB 7.4* 7.7* 7.9*  HCT 22.5* 23.5* 23.8*  PLT 179 200 193    Coag's  Recent Labs Lab 03/10/16 0823  INR 1.23    Sepsis Markers  Recent Labs Lab 03/10/2016 2235 03/10/16 0823  LATICACIDVEN  --  1.1  PROCALCITON 2.24  --     ABG  Recent Labs Lab 03/11/16 0900 03/11/16 1035 03/14/16 0702  PHART 7.381 7.318* 7.349*  PCO2ART 32.4 42.0 38.7  PO2ART 92.0 91.3 113*    Liver Enzymes  Recent Labs Lab 03/10/16 0936 03/11/16 0540 03/12/16 0217 03/14/16 1702 03/14/16 1728  AST 27  --  32  --   --   ALT 20  --  22  --  28  ALKPHOS 115  --  153*  --   --   BILITOT 0.8  --  0.7  --   --   ALBUMIN 1.5* 1.3* 1.3* 1.8*  --     Cardiac Enzymes  Recent Labs Lab 03/07/2016 2235 03/10/16 0322 03/10/16 0936   TROPONINI <0.03 <0.03 <0.03    Glucose  Recent Labs Lab 03/14/16 1116 03/14/16 1634 03/14/16 1952 03/14/16 2326 03/15/16 0324 03/15/16 0736  GLUCAP 102* 100* 103* 118* 127* 141*     DISCUSSION: Duane Harper is 70 y.o. male with PMH of significant tobacco use, recent CVA, MS, HTN, CKD, and BPH who was transferred from [facility] for acute respiratory failure requiring intubation in the setting of multilobar PNA and pleural effusions that cultured Citrobacter. Patient was also persistently hypotensive requiring pressor support.   ASSESSMENT / PLAN:  PULMONARY A: Acute Hypoxemic Hypercapneic resp fx 2/2 Empyema, B, with Citrobacter in pleural fluid (11/6) with Multilobar PNA S/P Chest tube R and L, 11/18, with small apical PTX, bilateral.  H/o tobacco abuse, likely COPD  P:   Continue vent. SAT/SBT as able Chest tubes to water . Drained 790 cc/24hr On meropenem 11/17>> May need lytics/tpa/pulmozyme. Holding off for now. Febrile  last 24 hrs > if persistent, may need rpt chest ct scan and add vanc.   CARDIOVASCULAR A:  H/o HTN  Echo 11/20 with EF of 60-65%, mod LVH and PAPP 51 History of UE DVT. LE doppler 11/20 without DVT Hypotension. Off pressor 11/19 P:  Albumin 12.5 mg q6h by nephro > got 4 doses on 11/20 KVO Levophed as needed for MAP < 65. Off this since 11/19  RENAL A:   AKI on CKD 2/2 sepsis/hypovolemia Non-anion gap metabolic acidosis: resolved BPH HAoCKD st 3A (Creat 1.3 in 10/2015) Anuria. UOP 75cc/+-0.4L/+7L P:   Off lasix. 7L (+). Appreciate renal recs Getting HD. Next due tomorrow.   Wife okay with temporary HD.   GASTROINTESTINAL A:   Recent emesis.  P:   IV Pepcid  Cont TF.  cont Miralax.   HEMATOLOGIC A:   Acute anemia, s/p 1u PRBC with appropriate response (admission)  Hgb stable P:  Monitor CBC  Transfuse HgB <7  INFECTIOUS A:   Multilobar PNA / Empyema 2/2 to Citrobacter. Febrile last 24 hrs   P:   Cont  Meropenem.  Chest tube drainage. May need tpa/pulmozyme.  Wife does not want heroics, VTS etc. Pancultured on 11/22 > if fever persistent, will add Vanc. Check PCT now.  May need chest ct scan if fever is persistent.   ENDOCRINE A:   T2DM, controlled P:   Sensitive SSI   NEUROLOGIC A:   H/o MS  Recent CVA (10/17)  Sedated  P:   RASS 0 to -1  Fentanyl gtt Versed pushes as needed   WUA   FAMILY  - Updates: wife updated at bedside 11/22  - Inter-disciplinary family meet or Palliative Care meeting due by: 11/24  I have spent 30 minutes of critical care time with this patient today.   Pollie MeyerJ. Angelo A de Dios, MD 03/15/2016, 10:51 AM [place] Pulmonary and Critical [facility] Pager [phone] After 3 pm or if no answer, call [phone]

## 2016-03-15 NOTE — Progress Notes (Signed)
Assessment:  1 AKI, hemodynamically mediated. Oliguric (creat 1.37 on 11/01/15), 1st HD on 11/22 2 CVA 3 VDRF 4 PNA/empyema/CT drainage Plan: Will proceed with next HD Friday    Subjective: Interval History:  Tol HD yesterday, kept even  Objective: Vital signs in last 24 hours: Temp:  [99.1 F (37.3 C)-101.5 F (38.6 C)] 101.1 F (38.4 C) (11/23 0738) Pulse Rate:  [86-101] 98 (11/23 0900) Resp:  [17-40] 30 (11/23 0900) BP: (108-132)/(52-64) 131/58 (11/23 0900) SpO2:  [96 %-99 %] 97 % (11/23 0900) FiO2 (%):  [40 %-50 %] 40 % (11/23 1000) Weight:  [87 kg (191 lb 12.8 oz)] 87 kg (191 lb 12.8 oz) (11/23 0449) Weight change: -2 kg (-4 lb 6.6 oz)  Intake/Output from previous day: 11/22 0701 - 11/23 0700 In: 960.1 [I.V.:392.5; NG/GT:517.6; IV Piggyback:50] Out: 900 [Urine:100; Chest Tube:800] Intake/Output this shift: Total I/O In: 78.8 [I.V.:28.8; IV Piggyback:50] Out: 18 [Urine:18]  General appearance: comfortable, not responding  Less edema  Lab Results:  Recent Labs  03/14/16 1702 03/15/16 0315  WBC 8.0 7.6  HGB 7.7* 7.9*  HCT 23.5* 23.8*  PLT 200 193   BMET:  Recent Labs  03/14/16 1702 03/15/16 0315  NA 137 138  K 5.5* 4.9  CL 103 103  CO2 21* 24  GLUCOSE 98 140*  BUN 89* 77*  CREATININE 9.70* 8.51*  CALCIUM 8.4* 8.2*   No results for input(s): PTH in the last 72 hours. Iron Studies: No results for input(s): IRON, TIBC, TRANSFERRIN, FERRITIN in the last 72 hours. Studies/Results: Dg Chest Port 1 View  Result Date: 03/15/2016 CLINICAL DATA:  Pneumothorax. EXAM: PORTABLE CHEST 1 VIEW COMPARISON:  One-view chest x-ray 03/14/2016. FINDINGS: The endotracheal tube is stable. The NG tube has advanced and now extends off the inferior border the film. Bilateral IJ catheters are in place. Bilateral chest tubes are in place. Small bilateral pneumothoraces are evident without significant interval change. Bilateral upper lobe airspace disease is again seen.  Medial right lower lobe airspace disease is seen. IMPRESSION: 1. No significant interval change in bilateral pneumothoraces. 2. Support apparatus including bilateral chest tubes are stable. 3. Similar appearance of bilateral patchy airspace disease. Electronically Signed   By: San Morelle M.D.   On: 03/15/2016 07:06   Dg Chest Port 1 View  Result Date: 03/14/2016 CLINICAL DATA:  Hypoxia.  New central catheter placement EXAM: PORTABLE CHEST 1 VIEW COMPARISON:  Study obtained earlier in the day FINDINGS: Right jugular catheter tip is in the superior vena cava. Left jugular catheter tip is in the left innominate vein. Endotracheal tube tip is 3.0 cm above the carina. Nasogastric tube tip is now in the lower esophagus. There are chest tubes on either side. There is a persistent small apical pneumothorax on the right. There is a small apical pneumothorax on the left as well. These pneumothoraces appear stable. There is persistent airspace consolidation in both upper lobes and left base regions. No new opacity. Heart size is normal. Pulmonary vascularity is normal. No evident adenopathy. There is atherosclerotic calcification in the aorta. IMPRESSION: Tube and catheter positions as described without pneumothorax. Note that the nasogastric tube tip is now in the distal esophagus. Advise advancing nasogastric tube at least 15 cm to insure that nasogastric tube tip and side port are in the stomach. Small pneumothoraces bilaterally, stable. No tension components. Areas of airspace consolidation in both upper lobes and left base, stable. Stable cardiac silhouette. There is aortic atherosclerosis. These results will be called to  the ordering clinician or representative by the Radiologist Assistant, and communication documented in the PACS or zVision Dashboard. Electronically Signed   By: Lowella Grip III M.D.   On: 03/14/2016 12:52   Dg Chest Port 1 View  Result Date: 03/14/2016 CLINICAL DATA:  Evaluate  for pneumothorax EXAM: PORTABLE CHEST 1 VIEW COMPARISON:  03/13/2016 FINDINGS: Cardiac shadow is stable. An endotracheal tube, nasogastric catheter and left jugular central line are again seen and stable. Bilateral chest tubes are again identified and stable. Previously seen small pneumothoraces are again identified and stable. Persistent upper lobe infiltrates are seen. No new focal abnormality is noted. IMPRESSION: Persistent small pneumothoraces with bilateral upper lobe infiltrates stable from the prior exam. Tubes and lines as described. Electronically Signed   By: Inez Catalina M.D.   On: 03/14/2016 07:40   Dg Chest Port 1 View  Result Date: 03/13/2016 CLINICAL DATA:  Possible pneumothorax EXAM: PORTABLE CHEST 1 VIEW COMPARISON:  Chest x-ray of 03/13/2016 FINDINGS: There are small bilateral pneumothoraces present better seen over the apices. Bilateral chest tubes remain. Parenchymal opacities remain in the upper lobes. Endotracheal tube appears to be in good position approximately 5.1 cm above the carina. Left IJ central venous line tip overlies the left innominate vein. Heart size is stable. Coarse lung markings are noted at the left lung base. IMPRESSION: 1. Little change in small bilateral primarily apical pneumothoraces with bilateral chest tubes remaining. 2. No change in primarily upper lobe parenchymal opacities bilaterally. 3. Endotracheal tube tip 5.1 cm above the carina Electronically Signed   By: Ivar Drape M.D.   On: 03/13/2016 15:14   Dg Abd Portable 1v  Result Date: 03/14/2016 CLINICAL DATA:  70 year old male with enteric tube placement. Initial encounter. EXAM: PORTABLE ABDOMEN - 1 VIEW COMPARISON:  Chest CT 03/10/2016. FINDINGS: Portable AP supine views of the lower chest, abdomen and pelvis at 1452 hours. Enteric tube courses to the left upper quadrant, side hole to level of the gastric fundus. Moderate gaseous distension of the stomach. Mild gaseous distention of proximal small  bowel. Paucity of bowel gas otherwise. Bilateral chest tubes are now demonstrated. Multifocal patchy left lung opacity. Stable cardiac size and mediastinal contours. No acute osseous abnormality identified. IMPRESSION: 1. Enteric tube courses to the stomach with side hole at the level of the gastric fundus. Moderate gaseous distension of the stomach. 2. Bilateral chest tubes in place.  Multifocal left lung opacity. Electronically Signed   By: Genevie Ann M.D.   On: 03/14/2016 15:10   Scheduled: . chlorhexidine gluconate (MEDLINE KIT)  15 mL Mouth Rinse BID  . famotidine  20 mg Per Tube QHS  . heparin subcutaneous  5,000 Units Subcutaneous Q8H  . insulin aspart  1-3 Units Subcutaneous Q4H  . ipratropium-albuterol  3 mL Nebulization Q6H  . mouth rinse  15 mL Mouth Rinse 10 times per day  . meropenem (MERREM) IV  500 mg Intravenous Daily  . polyethylene glycol  17 g Oral BID     LOS: 6 days   Aijalon Kirtz C 03/15/2016,10:23 AM

## 2016-03-16 ENCOUNTER — Inpatient Hospital Stay (HOSPITAL_COMMUNITY): Payer: PPO

## 2016-03-16 DIAGNOSIS — L8990 Pressure ulcer of unspecified site, unspecified stage: Secondary | ICD-10-CM | POA: Insufficient documentation

## 2016-03-16 DIAGNOSIS — Z515 Encounter for palliative care: Secondary | ICD-10-CM

## 2016-03-16 LAB — GLUCOSE, CAPILLARY
GLUCOSE-CAPILLARY: 149 mg/dL — AB (ref 65–99)
GLUCOSE-CAPILLARY: 123 mg/dL — AB (ref 65–99)
GLUCOSE-CAPILLARY: 115 mg/dL — AB (ref 65–99)
Glucose-Capillary: 139 mg/dL — ABNORMAL HIGH (ref 65–99)
Glucose-Capillary: 152 mg/dL — ABNORMAL HIGH (ref 65–99)

## 2016-03-16 LAB — CBC
HEMATOCRIT: 22.4 % — AB (ref 39.0–52.0)
HEMOGLOBIN: 7.3 g/dL — AB (ref 13.0–17.0)
MCH: 27.1 pg (ref 26.0–34.0)
MCHC: 32.6 g/dL (ref 30.0–36.0)
MCV: 83.3 fL (ref 78.0–100.0)
Platelets: 176 10*3/uL (ref 150–400)
RBC: 2.69 MIL/uL — AB (ref 4.22–5.81)
RDW: 16.5 % — ABNORMAL HIGH (ref 11.5–15.5)
WBC: 8 10*3/uL (ref 4.0–10.5)

## 2016-03-16 LAB — BLOOD GAS, ARTERIAL
Acid-base deficit: 4 mmol/L — ABNORMAL HIGH (ref 0.0–2.0)
Bicarbonate: 20.6 mmol/L (ref 20.0–28.0)
Drawn by: 355601
FIO2: 50
MECHVT: 500 mL
O2 Saturation: 97.6 %
PEEP: 10 cmH2O
Patient temperature: 99.2
RATE: 35 {breaths}/min
pCO2 arterial: 38.7 mmHg (ref 32.0–48.0)
pH, Arterial: 7.349 — ABNORMAL LOW (ref 7.350–7.450)
pO2, Arterial: 113 mmHg — ABNORMAL HIGH (ref 83.0–108.0)

## 2016-03-16 LAB — URINE CULTURE: Culture: NO GROWTH

## 2016-03-16 LAB — BASIC METABOLIC PANEL
ANION GAP: 12 (ref 5–15)
BUN: 100 mg/dL — ABNORMAL HIGH (ref 6–20)
CHLORIDE: 101 mmol/L (ref 101–111)
CO2: 24 mmol/L (ref 22–32)
CREATININE: 9.68 mg/dL — AB (ref 0.61–1.24)
Calcium: 7.8 mg/dL — ABNORMAL LOW (ref 8.9–10.3)
GFR calc non Af Amer: 5 mL/min — ABNORMAL LOW (ref >60)
GFR, EST AFRICAN AMERICAN: 6 mL/min — AB (ref >60)
Glucose, Bld: 141 mg/dL — ABNORMAL HIGH (ref 65–99)
POTASSIUM: 5.1 mmol/L (ref 3.5–5.1)
SODIUM: 137 mmol/L (ref 135–145)

## 2016-03-16 LAB — PREPARE RBC (CROSSMATCH)

## 2016-03-16 LAB — MAGNESIUM: MAGNESIUM: 2.6 mg/dL — AB (ref 1.7–2.4)

## 2016-03-16 LAB — PHOSPHORUS: PHOSPHORUS: 9.9 mg/dL — AB (ref 2.5–4.6)

## 2016-03-16 LAB — PROCALCITONIN: PROCALCITONIN: 1.46 ng/mL

## 2016-03-16 LAB — TRIGLYCERIDES: Triglycerides: 152 mg/dL — ABNORMAL HIGH (ref <150)

## 2016-03-16 MED ORDER — ALTEPLASE 2 MG IJ SOLR
2.0000 mg | Freq: Once | INTRAMUSCULAR | Status: DC | PRN
  Filled 2016-03-16: qty 2

## 2016-03-16 MED ORDER — LIDOCAINE HCL (PF) 1 % IJ SOLN: 5.0000 mL | INTRAMUSCULAR | Status: DC | PRN

## 2016-03-16 MED ORDER — PENTAFLUOROPROP-TETRAFLUOROETH EX AERO: 1.0000 "application " | INHALATION_SPRAY | CUTANEOUS | Status: DC | PRN

## 2016-03-16 MED ORDER — LIDOCAINE-PRILOCAINE 2.5-2.5 % EX CREA
1.0000 "application " | TOPICAL_CREAM | CUTANEOUS | Status: DC | PRN
  Filled 2016-03-16: qty 5

## 2016-03-16 MED ORDER — SODIUM CHLORIDE 0.9 % IV SOLN: 100.0000 mL | INTRAVENOUS | Status: DC | PRN

## 2016-03-16 MED ORDER — HEPARIN SODIUM (PORCINE) 1000 UNIT/ML DIALYSIS: 1000.0000 [IU] | INTRAMUSCULAR | Status: DC | PRN

## 2016-03-16 MED ORDER — HEPARIN SODIUM (PORCINE) 1000 UNIT/ML DIALYSIS: 20.0000 [IU]/kg | INTRAMUSCULAR | Status: DC | PRN

## 2016-03-16 MED ORDER — SODIUM CHLORIDE 0.9 % IV SOLN
Freq: Once | INTRAVENOUS | Status: AC
  Administered 2016-03-16: 10:00:00 via INTRAVENOUS

## 2016-03-16 MED ORDER — BISACODYL 10 MG RE SUPP
10.0000 mg | Freq: Every day | RECTAL | Status: DC | PRN
  Administered 2016-03-17: 10 mg via RECTAL
  Filled 2016-03-16: qty 1

## 2016-03-16 MED ORDER — ORAL CARE MOUTH RINSE
15.0000 mL | OROMUCOSAL | Status: DC
  Administered 2016-03-16 – 2016-03-19 (×29): 15 mL via OROMUCOSAL

## 2016-03-16 MED ORDER — ORAL CARE MOUTH RINSE: 15.0000 mL | Freq: Two times a day (BID) | OROMUCOSAL | Status: DC

## 2016-03-16 MED ORDER — FENTANYL CITRATE (PF) 100 MCG/2ML IJ SOLN: 25.0000 ug | INTRAMUSCULAR | Status: DC | PRN

## 2016-03-16 MED ORDER — CHLORHEXIDINE GLUCONATE 0.12% ORAL RINSE (MEDLINE KIT)
15.0000 mL | Freq: Two times a day (BID) | OROMUCOSAL | Status: DC
  Administered 2016-03-16 – 2016-03-19 (×6): 15 mL via OROMUCOSAL

## 2016-03-16 NOTE — Procedures (Signed)
HD in progress, currently stable hemodynamics.  No catheter issues.  Assessment:  1 AKI, hemodynamically mediated. Oliguric (creat 1.37 on 11/01/15), 1st HD on 11/22 2 CVA 3 VDRF 4 PNA/empyema/CT drainage  Cindia Hustead C

## 2016-03-16 NOTE — Progress Notes (Signed)
PULMONARY / CRITICAL CARE MEDICINE   Name: Duane Harper MRN: [id number] DOB: 06-21-1945    ADMISSION DATE:  04-06-2016  REFERRING MD:  [facility]   CHIEF COMPLAINT:  Respiratory Failure   Brief:  History obtained from wife and records review as patient is sedated/intubated.   Duane Harper is 70 y.o. 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 Had left sided deficits from this CVA as well as cognitive changes. While undergoing rehab for his CVA, he developed dyspnea. Was diagnosed with pneumonia and bilateral pleural effusions. Patient was started on Levaquin and required supplemental O2. Patient's pleural fluid isolated Citrobacter. Fluid analysis suggested transudate process. Patient was treated with Lasix and antibiotics were broadened.  On 11/13, patient had a syncopal episode while participating in PT. He was found to be hypotensive at that time. Patient continued to remain febrile with worsening hypoxia despite supplemental O2 and abx coverage. CTA chest obtained which was negative for PE and slight progression of severe multilobar PNA compared to prior CT.   On 11/14, patient stated he desired intubation as his WOB was significant on BiPAP. Bronchoscopy was performed which showed no endobronchial lesions and minimal inflammation of the bronchi in the left upper lobe. Bronchial cultures showed normal oral respiratory flora.   Had discussion with wife regarding patient's wishes. She reports that he would not want to remain on ventilator for long term. He would not wish to have a tracheostomy or a PEG tube. His wish would be to be a DNR. He would not want chest compressions or defibrillation.   Imaging DG chest 11/20:  Fairly stable appearance of the chest. Small right apical pneumothorax. Persistent bilateral lateral upper lobe infiltrates with mild bibasilar atelectasis or infiltrate. The support tubes are in reasonable position.  11/21: Tube and  catheter positions are unchanged. Small apical pneumothorax on each side persist. No tension component. Airspace consolidation in both upper lobes and left base are stable. No new opacity. Stable cardiac silhouette. There is aortic atherosclerosis.   11/22: Persistent small pneumothoraces with bilateral upper lobe infiltrates stable from the prior exam.   11/24: Worsening opacity bilaterally likely congestion vs infiltration. Unchanged small bilateral pneumothoraces.  STUDIES:  CT chest 11/18>>>Extensive bilateral pulmonary opacities worrisome for an inflammatory process, ARDS, or edema are stable. Malignancy is not excluded.Moderate bilateral pleural effusions have increased.  CULTURES: 11/6 pleural fluid L with Citrobacter sensitive to Cefepime, Meropenem. Resistant to cefazolin 11/18 rt effusion>>>NGTD 11/18 left effusion>>>NGTD 11/17 BC>>>NGTD 11/17 sputum>>>Normal flora 11/22 Urine >> 11/22 sputum >Pending. GS with rare yeast 11/23 blood > NGTD   ANTIBIOTICS: Vancomycin 11/17 >> 11/20 Meropenem 11/17 >>   LINES/TUBES: ETT 11/14 >>  Right Femoral 11/17 >>11/18 Left IJ 11/18>>> Rt chest tuibe 11/18>>> Left chest tube 11/18>>> Foley 11/15>> HD cath in Rt IJ 11/22>>  SIGNIFICANT EVENTS: 11/1: Admitted to [facility] for hypoxia and dyspnea, found to have multilobar PNA and pleural effusions  11/6: Thoracentesis performed; culture grew Citrobacter  11/13: CTA chest negative for PE  11/4: Patient intubated  11/17: Patient started on Levophed, transferred to [facility]  11/18- Chest tubes placed, came off pressors, line neck placed, fem dc'ed 11/20 - AKI. Nephro consulted 11/21- Anuria with worsening renal function 11/22-Spiked mild fever. Pan cultured. HD started  11/23- changed from [facility] to PCV due to dyssynchrony with vent.   SUBJECTIVE:  Slightly opens eyes in response to his name. PRVC to PCV yesterday due to  dyssynchrony with vent. He is getting HD now.    VITAL SIGNS: BP (!) 121/54   Pulse 88   Temp 99.4 F (37.4 C) (Oral)   Resp 16   Ht 5\' 9"  (1.753 m)   Wt 195 lb 15.8 oz (88.9 kg)   SpO2 100%   BMI 28.94 kg/m   HEMODYNAMICS: CVP:  [5 mmHg-10 mmHg] 7 mmHg  VENTILATOR SETTINGS: Vent Mode: PCV FiO2 (%):  [40 %-50 %] 50 % Set Rate:  [20 bmp-35 bmp] 20 bmp Vt Set:  [500 mL] 500 mL PEEP:  [10 cmH20] 10 cmH20 Plateau Pressure:  [18 cmH20-30 cmH20] 28 cmH20  INTAKE / OUTPUT: I/O last 3 completed shifts: In: 1836.4 [I.V.:588.8; NG/GT:1147.6; IV Piggyback:100] Out: 1275 [Urine:145; Chest Tube:1130]  PHYSICAL EXAMINATION: General: sick, opens eyes when addressed by name Neuro:  Opens eyes in response to sounds, doesn't follow command, RASS -2 HEENT:  ETT in place,  Mildly disynchronous with vent Cardiovascular:  RRR s1 s2  Lungs:  Coarse breath sounds bilaterally, no wheeze or crackles. Abdomen:  Soft, non-distended , no r/g Musculoskeletal:  Gr 2 edema in UE's Skin:  No rashes on exposed skin.   LABS:  BMET  Recent Labs Lab 03/14/16 1702 03/15/16 0315 03/16/16 0420  NA 137 138 137  K 5.5* 4.9 5.1  CL 103 103 101  CO2 21* 24 24  BUN 89* 77* 100*  CREATININE 9.70* 8.51* 9.68*  GLUCOSE 98 140* 141*    Electrolytes  Recent Labs Lab 03/14/16 0344 03/14/16 1702 03/15/16 0315 03/16/16 0420  CALCIUM 8.4* 8.4* 8.2* 7.8*  MG 2.4  --  2.3 2.6*  PHOS 9.2* 10.4* 8.5* 9.9*    CBC  Recent Labs Lab 03/14/16 1702 03/15/16 0315 03/16/16 0420  WBC 8.0 7.6 8.0  HGB 7.7* 7.9* 7.3*  HCT 23.5* 23.8* 22.4*  PLT 200 193 176    Coag's  Recent Labs Lab 03/10/16 0823  INR 1.23    Sepsis Markers  Recent Labs Lab 02/26/2016 2235 03/10/16 0823 03/15/16 1126 03/16/16 0420  LATICACIDVEN  --  1.1  --   --   PROCALCITON 2.24  --  1.54 1.46    ABG  Recent Labs Lab 03/11/16 1035 03/14/16 0702 03/15/16 1654  PHART 7.318* 7.349* 7.393  PCO2ART 42.0 38.7 37.4  PO2ART 91.3 113* 103    Liver  Enzymes  Recent Labs Lab 03/10/16 0936 03/11/16 0540 03/12/16 0217 03/14/16 1702 03/14/16 1728  AST 27  --  32  --   --   ALT 20  --  22  --  28  ALKPHOS 115  --  153*  --   --   BILITOT 0.8  --  0.7  --   --   ALBUMIN 1.5* 1.3* 1.3* 1.8*  --     Cardiac Enzymes  Recent Labs Lab 03/04/2016 2235 03/10/16 0322 03/10/16 0936  TROPONINI <0.03 <0.03 <0.03    Glucose  Recent Labs Lab 03/15/16 0736 03/15/16 1153 03/15/16 1600 03/15/16 2006 03/15/16 2347 03/16/16 0407  GLUCAP 141* 140* 144* 148* 119* 149*    ASSESSMENT / PLAN:  PULMONARY A: Acute Hypoxemic Hypercapneic resp fx 2/2 Empyema, B, with Citrobacter in pleural fluid (11/6) with Multilobar PNA S/P Chest tube R and L, 11/18, with small apical PTX, bilateral.  H/o tobacco abuse, likely COPD  PRCV to PCV on 11/23. P:   Continue vent. ABG reviewed SAT/SBT as able Chest tubes to water . Drained 850 cc/24hr On meropenem 11/17>>  CARDIOVASCULAR A:  H/o HTN  Echo 11/20 with EF of 60-65%, mod LVH and PAPP 51 History of UE DVT. LE doppler 11/20 without DVT Hypotension. Off pressor 11/19 P:  S/p 12.5 mg Albumin 4 doses on 11/20 KVO Levophed as needed for MAP < 65. Off this since 11/19  RENAL A:   AKI on CKD 2/2 sepsis/hypovolemia Non-anion gap metabolic acidosis: resolved BPH AoCKD st 3A (Creat 1.3 in 10/2015) Anuria. UOP 125cc/+1L/+8.5L. But patient on HD P:   Off lasix. 8.5L (+) but on HD Appreciate renal recs HD today  GASTROINTESTINAL A:   Recent emesis.  No BM yet P:   IV Pepcid  Cont TF.  No BM with Miralax twice a day. Will attempt enema KUB > no obsturction   HEMATOLOGIC A:   Acute anemia, s/p 1u PRBC with appropriate response (admission)  Hgb stable P:  Monitor CBC  Transfuse HgB <7  INFECTIOUS A:   Multilobar PNA / Empyema 2/2 to Citrobacter.  Fever curve trending down. Last fever 11/23 at 11am Repeat blood cultures negative CXR worsening opacities bilaterally P:    Cont Meropenem.  Chest tube drainage. Wife does not want heroics, VTS etc.   ENDOCRINE A:   T2DM, controlled P:   Sensitive SSI   NEUROLOGIC A:   H/o MS  Recent CVA (10/17)  Sedated  P:   RASS 0 to -1  Fentanyl gtt + PRN Versed pushes as needed   WUA   FAMILY  - Updates: wife updated at bedside 11/22  - Inter-disciplinary family meet or Palliative Care meeting due by: 11/24  Candelaria Stagersaye Gonfa, MD.  03/16/16 6:29 AM PGY-2   ATTENDING NOTE / ATTESTATION NOTE :   I have discussed the case with the resident/APP  Candelaria Stagersaye Gonfa MD  I agree with the resident/APP's  history, physical examination, assessment, and plans.    I have edited the above note and modified it according to our agreed history, physical examination, assessment and plan.   I have spent 30  minutes of critical care time with this patient today.  Family :Family updated at length today.  I EXTENSIVELY D/W WIFE REGARDING OVER ALL POOR PROGNOSIS. WE DECIDED TO WAIT UNTIL Monday >>> IF not better, wife will make him comfort care.  Will consult Palliative care. Wife DOES NOT WANT ANY INVASIVE procedure (like chest tube) or HIGH DOSE PRESSORS.    Pollie MeyerJ. Angelo A de Dios, MD 03/16/2016, 10:23 AM [facility] Pager [phone] After 3 pm or if no answer, call [phone]

## 2016-03-16 NOTE — Consult Note (Signed)
Consultation Note Date: 03/16/2016   Patient Name: Duane Harper  DOB: 1945-05-25  MRN: [id number]  Age / Sex: 70 y.o., male  PCP: Nicoletta Dress, MD Referring Physician: Marshell Garfinkel, MD  Reason for Consultation: Establishing goals of care and Psychosocial/spiritual support  HPI/Patient Profile: 70 y.o. male  with past medical history of Type 2 diabetes, MS (diagnosed 2 years ago) with behavioral changes, hypertension, BPH, chronic kidney disease, hyperlipidemia, CVA 01/31/2016 with left-sided deficits admitted on 03/02/2016 with shortness of breath while undergoing rehabilitation for his CVA. He was diagnosed with pneumonia and bilateral pleural effusions. On 03/05/2016 patient had a syncopal episode while participating in physical therapy . He was found to be hypotensive at the time, became febrile despite antibiotic coverage. CTA of the chest revealed no PE but progression of multi lobar pneumonia compared to prior CT. On 11/14, patient required intubation secondary to his increased work of breathing. A . On 03/10/2016 chest tubes were placed, he came off pressors. He now is in acute kidney injury and is undergoing hemodialysis; now anuric. He is still intubated. Wife is at his bedside  Clinical Assessment and Goals of Care: Patient is in ICU, currently on full ventilator support. His wife is at the bedside. Per discussion with wife, and chart review why shares with attending that her husband would not want to live on a trach or PEG, before in this condition. She has notified her 2 children and his 1 daughter as well as other relevant family and friends of her husbands critical condition. He is still intubated but would not want CPR. Plan at this point is watchful waiting through the weekend and if no improvement likely will proceed with one way extubation. Patient's wife shares with me that over the past 2  years things of been very difficult for them. He has had dramatic personality changes, anger, which precipitated a separation at one point. She likens this to [place] wife Patient has 1 daughter from a previous marriage who has been notified of his condition. He also raises 2 stepchildren who have also been notified of his condition. Wife reports that all families are in agreement that he would not want to live this way if he could not improve    SUMMARY OF RECOMMENDATIONS   In the event of arrest, no CPR no  defibrillation  Continued intubation and otherwise full support for now; treat the treatable with antibiotics Goal is watchful waiting through the weekend. Plan to reevaluate on 03/19/2016 and evalate one way extubation and proceeding towards comfort care No trach or PEG Continue hemodialysis for now. Would not want long range Code Status/Advance Care Planning:  DNR continued intubation for now    Symptom Management:   Pain: Continue fentanyl as needed  Agitation: Continue Versed as needed  Constipation: Continue MiraLAX daily; will add Dulcolax suppository daily as needed. Likely will need an enema today as well as digital disimpaction to relieve impaction. Last bowel movement was 7 days ago  Palliative Prophylaxis:  Aspiration, Bowel Regimen, Delirium Protocol, Frequent Pain Assessment, Oral Care and Turn Reposition  Additional Recommendations (Limitations, Scope, Preferences):  No Chemotherapy, No Radiation, No Surgical Procedures and No Tracheostomy  Psycho-social/Spiritual:   Desire for further Chaplaincy support:no  Additional Recommendations: Grief/Bereavement Support  Prognosis:   Unable to determine  Discharge Planning: To Be Determined      Primary Diagnoses: Present on Admission: . Acute respiratory failure ([place])   I have reviewed the medical record, interviewed the patient and family, and examined the patient. The following aspects are  pertinent.  Past Medical History:  Diagnosis Date  . Diabetes ([place])   . Hyperlipemia   . Multiple sclerosis ([place])   . Stroke [facility] 2007   Social History   Social History  . Marital status: Married    Spouse name: Vaughan Basta   . Number of children: 1  . Years of education: college   Occupational History  . retired     Social History Main Topics  . Smoking status: Current Some Day Smoker  . Smokeless tobacco: Never Used     Comment: 1-2 cigarettes a day, 11/30/15 1/4- 1/2 PPD  . Alcohol use 4.2 - 8.4 oz/week    7 - 14 Standard drinks or equivalent per week     Comment: occas  . Drug use: No  . Sexual activity: Not on file   Other Topics Concern  . Not on file   Social History Narrative   Live with wife Vaughan Basta   Drinks 3 cups of coffee some days    Family History  Problem Relation Age of Onset  . Heart attack Father   . Restless legs syndrome Father   . Stroke    . Heart attack     Scheduled Meds: . chlorhexidine gluconate (MEDLINE KIT)  15 mL Mouth Rinse BID  . famotidine  20 mg Per Tube QHS  . heparin subcutaneous  5,000 Units Subcutaneous Q8H  . insulin aspart  1-3 Units Subcutaneous Q4H  . ipratropium-albuterol  3 mL Nebulization Q6H  . mouth rinse  15 mL Mouth Rinse 10 times per day  . meropenem (MERREM) IV  500 mg Intravenous Daily  . polyethylene glycol  17 g Oral BID   Continuous Infusions: . feeding supplement (VITAL AF 1.2 CAL) 1,000 mL (03/16/16 1000)  . fentaNYL infusion INTRAVENOUS 150 mcg/hr (03/16/16 1159)   PRN Meds:.sodium chloride, sodium chloride, sodium chloride, acetaminophen, albuterol, alteplase, bisacodyl, fentaNYL (SUBLIMAZE) injection, heparin, lidocaine (PF), lidocaine-prilocaine, midazolam, pentafluoroprop-tetrafluoroeth Medications Prior to Admission:  Prior to Admission medications   Medication Sig Start Date End Date Taking? Authorizing Provider  acetaminophen (TYLENOL) 325 MG tablet Take 650 mg by mouth every 6 (six) hours as needed  for moderate pain.    Yes Historical Provider, MD  amLODipine (NORVASC) 10 MG tablet Take 10 mg by mouth daily.  11/11/15  Yes Historical Provider, MD  aspirin 325 MG EC tablet Take 325 mg by mouth daily.   Yes Historical Provider, MD  atorvastatin (LIPITOR) 40 MG tablet Take 40 mg by mouth daily at 6 PM.   Yes Historical Provider, MD  ceFEPIme 2 g in dextrose 5 % 50 mL Inject 2 g into the vein every 12 (twelve) hours. Started 11/12 for 7 days ending 11/19   Yes Historical Provider, MD  cholecalciferol (VITAMIN D) 1000 UNITS tablet Take 2,000 Units by mouth daily.    Yes Historical Provider, MD  clopidogrel (PLAVIX) 75 MG tablet Take 75 mg by mouth daily.  Yes Historical Provider, MD  ferrous sulfate 325 (65 FE) MG EC tablet Take 325 mg by mouth daily with breakfast.   Yes Historical Provider, MD  finasteride (PROSCAR) 5 MG tablet Take 5 mg by mouth daily.   Yes Historical Provider, MD  fluconazole (DIFLUCAN) 200 MG tablet Take 200 mg by mouth daily.   Yes Historical Provider, MD  gabapentin (NEURONTIN) 300 MG capsule Take 300 mg by mouth 3 (three) times daily.  10/26/15  Yes Historical Provider, MD  glimepiride (AMARYL) 4 MG tablet Take 4 mg by mouth daily with breakfast.   Yes Historical Provider, MD  hydrALAZINE (APRESOLINE) 50 MG tablet Take 50 mg by mouth 2 (two) times daily.  11/16/15  Yes Historical Provider, MD  labetalol (NORMODYNE) 200 MG tablet Take 200 mg by mouth 2 (two) times daily.   Yes Historical Provider, MD  levofloxacin (LEVAQUIN) 750 MG/150ML SOLN Inject 750 mg into the vein daily. Started 11/11 for 7 days ending 11/18   Yes Historical Provider, MD  lisinopril (PRINIVIL,ZESTRIL) 5 MG tablet Take 5 mg by mouth daily.   Yes Historical Provider, MD  metFORMIN (GLUCOPHAGE-XR) 500 MG 24 hr tablet Take 500 mg by mouth daily with breakfast.   Yes Historical Provider, MD  Omega-3 Fatty Acids (FISH OIL) 1000 MG CAPS Take 2,000 mg by mouth 2 (two) times daily.   Yes Historical Provider, MD    pantoprazole (PROTONIX) 40 MG tablet Take 40 mg by mouth daily.   Yes Historical Provider, MD  senna-docusate (SENOKOT-S) 8.6-50 MG tablet Take 1 tablet by mouth at bedtime.   Yes Historical Provider, MD  tamsulosin (FLOMAX) 0.4 MG CAPS capsule Take 0.4 mg by mouth at bedtime.   Yes Historical Provider, MD  valsartan-hydrochlorothiazide (DIOVAN-HCT) 320-25 MG tablet Take 1 tablet by mouth daily.   Yes Historical Provider, MD  aspirin EC 81 MG tablet Take 81 mg by mouth daily.    Historical Provider, MD  Interferon Beta-1a 22 MCG/0.5ML SOSY Inject 44 mcg into the skin 3 (three) times a week. Patient not taking: Reported on 03/11/2016 07/29/15   Penni Bombard, MD  lisinopril (PRINIVIL,ZESTRIL) 40 MG tablet Take 40 mg by mouth daily.    Historical Provider, MD  vancomycin 1,500 mg in sodium chloride 0.9 % 500 mL Inject 1,500 mg into the vein daily. Started 11/14 for 3 days ended 11/17    Historical Provider, MD   No Known Allergies Review of Systems  Unable to perform ROS: Intubated    Physical Exam  Constitutional: He appears well-developed and well-nourished.  HENT:  Head: Normocephalic and atraumatic.  Cardiovascular: Normal rate.   Genitourinary:  Genitourinary Comments: foley  Neurological:  Minimally responsive  Skin: Skin is warm and dry.  Nursing note and vitals reviewed.   Vital Signs: BP 121/71   Pulse (!) 110   Temp (!) 101.9 F (38.8 C) (Oral)   Resp 18   Ht _0  (1.753 m)   Wt 88.9 kg (195 lb 15.8 oz)   SpO2 97%   BMI 28.94 kg/m  Pain Assessment: CPOT       SpO2: SpO2: 97 % O2 Device:SpO2: 97 % O2 Flow Rate: .   IO: Intake/output summary:  Intake/Output Summary (Last 24 hours) at 03/16/16 1341 Last data filed at 03/16/16 1300  Gross per 24 hour  Intake           2223.5 ml  Output             1697 ml  Net            526.5 ml    LBM: Last BM Date:  (No BM since admition/MD aware) Baseline Weight: Weight: 83.3 kg (183 lb 10.3 oz) Most recent  weight: Weight: 88.9 kg (195 lb 15.8 oz)     Palliative Assessment/Data:   Flowsheet Rows   Flowsheet Row Most Recent Value  Intake Tab  Referral Department  Hospitalist  Unit at Time of Referral  ICU  Palliative Care Primary Diagnosis  Sepsis/Infectious Disease  Date Notified  03/16/16  Palliative Care Type  New Palliative care  Reason for referral  Clarify Goals of Care, Psychosocial or Spiritual support  Date of Admission  03/02/16  Date first seen by Palliative Care  03/16/16  # of days Palliative referral response time  0 Day(s)  # of days IP prior to Palliative referral  14  Clinical Assessment  Palliative Performance Scale Score  20%  Pain Max last 24 hours  Not able to report  Pain Min Last 24 hours  Not able to report  Dyspnea Max Last 24 Hours  Not able to report  Dyspnea Min Last 24 hours  Not able to report  Nausea Max Last 24 Hours  Not able to report  Nausea Min Last 24 Hours  Not able to report  Anxiety Max Last 24 Hours  Not able to report  Anxiety Min Last 24 Hours  Not able to report  Other Max Last 24 Hours  Not able to report  Psychosocial & Spiritual Assessment  Palliative Care Outcomes  Patient/Family meeting held?  Yes  Who was at the meeting?  wife  Palliative Care Outcomes  Clarified goals of care  Patient/Family wishes: Interventions discontinued/not started   PEG, Trach  Palliative Care follow-up planned  Yes, Facility      Time In: 1000 Time Out: 1040 Time Total: 70 min Greater than 50%  of this time was spent counseling and coordinating care related to the above assessment and plan. Staffed with Dr. Corrie Dandy  Signed by: Dory Horn, NP   Please contact Palliative Medicine Team phone at [phone] for questions and concerns.  For individual provider: See Shea Evans

## 2016-03-17 ENCOUNTER — Inpatient Hospital Stay (HOSPITAL_COMMUNITY): Payer: PPO

## 2016-03-17 DIAGNOSIS — J9602 Acute respiratory failure with hypercapnia: Secondary | ICD-10-CM

## 2016-03-17 LAB — TYPE AND SCREEN
ABO/RH(D): O NEG
Antibody Screen: NEGATIVE
UNIT DIVISION: 0

## 2016-03-17 LAB — BASIC METABOLIC PANEL
ANION GAP: 11 (ref 5–15)
BUN: 74 mg/dL — AB (ref 6–20)
CALCIUM: 7.9 mg/dL — AB (ref 8.9–10.3)
CO2: 26 mmol/L (ref 22–32)
CREATININE: 7.54 mg/dL — AB (ref 0.61–1.24)
Chloride: 99 mmol/L — ABNORMAL LOW (ref 101–111)
GFR calc Af Amer: 8 mL/min — ABNORMAL LOW (ref >60)
GFR, EST NON AFRICAN AMERICAN: 6 mL/min — AB (ref >60)
GLUCOSE: 125 mg/dL — AB (ref 65–99)
Potassium: 4.9 mmol/L (ref 3.5–5.1)
Sodium: 136 mmol/L (ref 135–145)

## 2016-03-17 LAB — GLUCOSE, CAPILLARY
GLUCOSE-CAPILLARY: 121 mg/dL — AB (ref 65–99)
GLUCOSE-CAPILLARY: 140 mg/dL — AB (ref 65–99)
GLUCOSE-CAPILLARY: 129 mg/dL — AB (ref 65–99)
GLUCOSE-CAPILLARY: 158 mg/dL — AB (ref 65–99)
GLUCOSE-CAPILLARY: 156 mg/dL — AB (ref 65–99)
Glucose-Capillary: 145 mg/dL — ABNORMAL HIGH (ref 65–99)

## 2016-03-17 LAB — CBC
HCT: 26 % — ABNORMAL LOW (ref 39.0–52.0)
HEMOGLOBIN: 8.5 g/dL — AB (ref 13.0–17.0)
MCH: 27.2 pg (ref 26.0–34.0)
MCHC: 32.7 g/dL (ref 30.0–36.0)
MCV: 83.3 fL (ref 78.0–100.0)
PLATELETS: 181 10*3/uL (ref 150–400)
RBC: 3.12 MIL/uL — ABNORMAL LOW (ref 4.22–5.81)
RDW: 16.6 % — AB (ref 11.5–15.5)
WBC: 9.4 10*3/uL (ref 4.0–10.5)

## 2016-03-17 LAB — CULTURE, RESPIRATORY: CULTURE: NORMAL

## 2016-03-17 LAB — CULTURE, RESPIRATORY W GRAM STAIN

## 2016-03-17 LAB — PHOSPHORUS: PHOSPHORUS: 8.4 mg/dL — AB (ref 2.5–4.6)

## 2016-03-17 LAB — PROCALCITONIN: PROCALCITONIN: 1.48 ng/mL

## 2016-03-17 LAB — MAGNESIUM: MAGNESIUM: 2.3 mg/dL (ref 1.7–2.4)

## 2016-03-17 NOTE — Progress Notes (Signed)
Pharmacy Antibiotic Note Duane Harper is a 70 y.o. male admitted on 03/30/16 from OSH with respiratory failure and multifocal  pneumonia.  Patient with bilateral empyema with continued drainage in chest tubes. Pharmacy has been consulted for meropenem dosing. Afebrile, WBC wnl and patient has been undergoing HD with last session on 11/24.  Plan Continue Meropenem 500 mg q24hr.  Follow up cultures, HD plans, and clinical picture Follow up family meeting 11/27 for GOC Patient is now DNR  Height: 5\' 9"  (175.3 cm) Weight: 196 lb 10.4 oz (89.2 kg) IBW/kg (Calculated) : 70.7  Temp (24hrs), Avg:100.1 F (37.8 C), Min:98.7 F (37.1 C), Max:102.8 F (39.3 C)   Recent Labs Lab 03/13/16 0445 03/14/16 0344 03/14/16 1702 03/15/16 0315 03/16/16 0420 03/17/16 0500  WBC 7.6  --  8.0 7.6 8.0 9.4  CREATININE 7.82* 8.98* 9.70* 8.51* 9.68* 7.54*    Estimated Creatinine Clearance: 10.2 mL/min (by C-G formula based on SCr of 7.54 mg/dL (H)).    No Known Allergies  11/2 Zosyn at OSH >> 11/11 11/8 Levaquin at OSH >> 11/15 11/12 Cefepime at OSH >> 11/16 11/17 meropenem >>  11/17 vancomycin >> 11/20  11/17 BCx: ngtd 11/17 UCx:  ngtd 11/17 Sputum:  ngtd 11/17 MRSA PCR: negative  11/17 strep pna antigen: neg 11/17 Legionella antigen: neg 11/18 pleural fluid: ngtd 11/22 TA: normal flora 11/22 UCx: final 11/23 BCx2: ngtd  11/15 bronch (OSH): few GPC, rare GNR 11/15 AFB (OSH): neg 11/6 pleural fluid (OSH): citrobacter sens to cefepime and merrem  Arlean Hopping. Newman Pies, PharmD, BCPS Clinical Pharmacist Pager [phone] 03/17/16 10:24 AM

## 2016-03-17 NOTE — Progress Notes (Signed)
Assessment:  1 AKI, hemodynamically mediated. Oliguric (creat 1.37 on 11/01/15), 1st HD on 11/22, last HD 11/24 2 CVA 3 VDRF 4 PNA/empyema/CT drainage Plan: Will proceed with HD prn   Objective: Vital signs in last 24 hours: Temp:  [98.7 F (37.1 C)-102.8 F (39.3 C)] 99.2 F (37.3 C) (11/25 0815) Pulse Rate:  [71-110] 106 (11/25 1100) Resp:  [12-27] 23 (11/25 1100) BP: (101-152)/(49-82) 151/67 (11/25 1000) SpO2:  [96 %-100 %] 97 % (11/25 1100) FiO2 (%):  [40 %-50 %] 40 % (11/25 1100) Weight:  [89.2 kg (196 lb 10.4 oz)] 89.2 kg (196 lb 10.4 oz) (11/25 0405) Weight change: 0.3 kg (10.6 oz)  Intake/Output from previous day: 11/24 0701 - 11/25 0700 In: 2965.1 [P.O.:480; I.V.:555.1; Blood:270; NG/GT:1580; IV Piggyback:50] Out: 1380 [Urine:95; Chest Tube:785] Intake/Output this shift: Total I/O In: 353.6 [I.V.:63.6; NG/GT:240; IV Piggyback:50] Out: -   General appearance: unresponsive GI: soft Extremities: edema 1+ Intub  Lab Results:  Recent Labs  03/16/16 0420 03/17/16 0500  WBC 8.0 9.4  HGB 7.3* 8.5*  HCT 22.4* 26.0*  PLT 176 181   BMET:  Recent Labs  03/16/16 0420 03/17/16 0500  NA 137 136  K 5.1 4.9  CL 101 99*  CO2 24 26  GLUCOSE 141* 125*  BUN 100* 74*  CREATININE 9.68* 7.54*  CALCIUM 7.8* 7.9*   No results for input(s): PTH in the last 72 hours. Iron Studies: No results for input(s): IRON, TIBC, TRANSFERRIN, FERRITIN in the last 72 hours. Studies/Results: Dg Chest Port 1 View  Result Date: 03/17/2016 CLINICAL DATA:  Respiratory failure EXAM: PORTABLE CHEST 1 VIEW COMPARISON:  03/19/2016 FINDINGS: Cardiac shadow is stable. Endotracheal tube, nasogastric catheter, left jugular and right jugular central lines are again seen and stable. The small pneumothorax seen on the right is again identified and stable. The left pneumothorax has resolved in the interval. Biapical infiltrates are seen. Left basilar infiltrate is noted as well. Bilateral chest  tubes are again seen. No new focal abnormality is seen. IMPRESSION: Resolution of left pneumothorax. Persistent right pneumothorax and biapical infiltrates. Persistent left basilar changes are noted. Electronically Signed   By: Inez Catalina M.D.   On: 03/17/2016 07:32   Dg Chest Port 1 View  Result Date: 03/16/2016 CLINICAL DATA:  Intubation. EXAM: PORTABLE CHEST 1 VIEW COMPARISON:  03/15/2016. FINDINGS: Endotracheal tube, right IJ line, left IJ line, bilateral chest tubes in stable position. A low lung volumes. Stable bilateral small pneumothoraces again noted. Diffuse bilateral airspace disease again noted. IMPRESSION: 1. Lines and tubes including bilateral chest tubes in stable position. 2.  Persistent unchanged small bilateral pneumothoraces. 3. Low lung volumes. Diffuse bilateral airspace disease again noted. Electronically Signed   By: Marcello Moores  Register   On: 03/16/2016 06:54   Dg Abd Portable 1v  Result Date: 03/16/2016 CLINICAL DATA:  Abdominal tenderness EXAM: PORTABLE ABDOMEN - 1 VIEW COMPARISON:  March 14, 2016 FINDINGS: Nasogastric tube tip and side port in stomach. There is no bowel dilatation or air-fluid level suggesting bowel obstruction. No free air. Chest tube noted in medial left base region. IMPRESSION: Nasogastric tube tip and side port in stomach. Bowel gas pattern unremarkable. No bowel obstruction or free air evident. Electronically Signed   By: Lowella Grip III M.D.   On: 03/16/2016 07:41    Scheduled: . chlorhexidine gluconate (MEDLINE KIT)  15 mL Mouth Rinse BID  . famotidine  20 mg Per Tube QHS  . heparin subcutaneous  5,000 Units Subcutaneous Q8H  .  insulin aspart  1-3 Units Subcutaneous Q4H  . ipratropium-albuterol  3 mL Nebulization Q6H  . mouth rinse  15 mL Mouth Rinse 10 times per day  . meropenem (MERREM) IV  500 mg Intravenous Daily  . polyethylene glycol  17 g Oral BID    LOS: 8 days   Yaviel Kloster C 03/17/2016,11:49 AM

## 2016-03-17 NOTE — Progress Notes (Signed)
PULMONARY / CRITICAL CARE MEDICINE   Name: Duane Harper MRN: [id number] DOB: 21-Jan-1946    ADMISSION DATE:  03/08/2016  REFERRING MD:  [facility]   CHIEF COMPLAINT:  Respiratory Failure   Brief:  History obtained from wife and records review as patient is sedated/intubated.   Duane Harper is 70 y.o. 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 Had left sided deficits from this CVA as well as cognitive changes. While undergoing rehab for his CVA, he developed dyspnea. Was diagnosed with pneumonia and bilateral pleural effusions. Patient was started on Levaquin and required supplemental O2. Patient's pleural fluid isolated Citrobacter. Fluid analysis suggested transudate process. Patient was treated with Lasix and antibiotics were broadened.  On 11/13, patient had a syncopal episode while participating in PT. He was found to be hypotensive at that time. Patient continued to remain febrile with worsening hypoxia despite supplemental O2 and abx coverage. CTA chest obtained which was negative for PE and slight progression of severe multilobar PNA compared to prior CT.   On 11/14, patient stated he desired intubation as his WOB was significant on BiPAP. Bronchoscopy was performed which showed no endobronchial lesions and minimal inflammation of the bronchi in the left upper lobe. Bronchial cultures showed normal oral respiratory flora.   Had discussion with wife regarding patient's wishes. She reports that he would not want to remain on ventilator for long term. He would not wish to have a tracheostomy or a PEG tube. His wish would be to be a DNR. He would not want chest compressions or defibrillation.   Imaging DG chest 11/20:  Fairly stable appearance of the chest. Small right apical pneumothorax. Persistent bilateral lateral upper lobe infiltrates with mild bibasilar atelectasis or infiltrate. The support tubes are in reasonable position.  11/21: Tube and  catheter positions are unchanged. Small apical pneumothorax on each side persist. No tension component. Airspace consolidation in both upper lobes and left base are stable. No new opacity. Stable cardiac silhouette. There is aortic atherosclerosis.   11/22: Persistent small pneumothoraces with bilateral upper lobe infiltrates stable from the prior exam.   11/24: Worsening opacity bilaterally likely congestion vs infiltration. Unchanged small bilateral pneumothoraces.  STUDIES:  CT chest 11/18>>>Extensive bilateral pulmonary opacities worrisome for an inflammatory process, ARDS, or edema are stable. Malignancy is not excluded.Moderate bilateral pleural effusions have increased.  CULTURES: 11/6 pleural fluid L with Citrobacter sensitive to Cefepime, Meropenem. Resistant to cefazolin 11/18 rt effusion>>>NGTD 11/18 left effusion>>>NGTD 11/17 BC>>>NGTD 11/17 sputum>>>Normal flora 11/22 Urine >> 11/22 sputum >Pending. GS with rare yeast 11/23 blood > NGTD   ANTIBIOTICS: Vancomycin 11/17 >> 11/20 Meropenem 11/17 >>   LINES/TUBES: ETT 11/14 >>  Right Femoral 11/17 >>11/18 Left IJ 11/18>>> Rt chest tuibe 11/18>>> Left chest tube 11/18>>> Foley 11/15>> HD cath in Rt IJ 11/22>>  SIGNIFICANT EVENTS: 11/1: Admitted to [facility] for hypoxia and dyspnea, found to have multilobar PNA and pleural effusions  11/6: Thoracentesis performed; culture grew Citrobacter  11/13: CTA chest negative for PE  11/4: Patient intubated  11/17: Patient started on Levophed, transferred to [facility]  11/18- Chest tubes placed, came off pressors, line neck placed, fem dc'ed 11/20 - AKI. Nephro consulted 11/21- Anuria with worsening renal function 11/22-Spiked mild fever. Pan cultured. HD started  11/23- changed from [facility] to PCV due to dyssynchrony with vent.   SUBJECTIVE:  Slightly opens eyes in response to his name. No clinical improvement. Remains oliguric.  VITAL SIGNS: BP (!) 163/67    Pulse (!) 108   Temp 100 F (37.8 C) (Oral)   Resp (!) 30   Ht 5\' 9"  (1.753 m)   Wt 89.2 kg (196 lb 10.4 oz)   SpO2 94%   BMI 29.04 kg/m   HEMODYNAMICS: CVP:  [10 mmHg-84 mmHg] 84 mmHg  VENTILATOR SETTINGS: Vent Mode: PCV FiO2 (%):  [40 %-50 %] 40 % Set Rate:  [20 bmp] 20 bmp PEEP:  [10 cmH20] 10 cmH20 Plateau Pressure:  [18 cmH20-29 cmH20] 29 cmH20  INTAKE / OUTPUT: I/O last 3 completed shifts: In: 4095.1 [P.O.:480; I.V.:765.1; Blood:270; Other:30; NG/GT:2500; IV Piggyback:50] Out: 1980 [Urine:175; Other:500; Chest Tube:1305]  PHYSICAL EXAMINATION: General: sick, some grimace to deep sternal rub Neuro:  Opens eyes in response to pain, doesn't follow command, RASS -2 HEENT:  ETT in place,  Mildly disynchronous with vent Cardiovascular:  RRR s1 s2  Lungs:  Coarse breath sounds bilaterally, no wheeze or crackles. Abdomen:  Soft, non-distended , no r/g Musculoskeletal:  Gr 2 edema in UE's Skin:  No rashes on exposed skin.   LABS:  BMET  Recent Labs Lab 03/15/16 0315 03/16/16 0420 03/17/16 0500  NA 138 137 136  K 4.9 5.1 4.9  CL 103 101 99*  CO2 24 24 26   BUN 77* 100* 74*  CREATININE 8.51* 9.68* 7.54*  GLUCOSE 140* 141* 125*    Electrolytes  Recent Labs Lab 03/15/16 0315 03/16/16 0420 03/17/16 0500  CALCIUM 8.2* 7.8* 7.9*  MG 2.3 2.6* 2.3  PHOS 8.5* 9.9* 8.4*    CBC  Recent Labs Lab 03/15/16 0315 03/16/16 0420 03/17/16 0500  WBC 7.6 8.0 9.4  HGB 7.9* 7.3* 8.5*  HCT 23.8* 22.4* 26.0*  PLT 193 176 181    Coag's No results for input(s): APTT, INR in the last 168 hours.  Sepsis Markers  Recent Labs Lab 03/15/16 1126 03/16/16 0420 03/17/16 0500  PROCALCITON 1.54 1.46 1.48    ABG  Recent Labs Lab 03/11/16 1035 03/14/16 0702 03/15/16 1654  PHART 7.318* 7.349* 7.393  PCO2ART 42.0 38.7 37.4  PO2ART 91.3 113* 103    Liver Enzymes  Recent Labs Lab 03/11/16 0540 03/12/16 0217 03/14/16 1702 03/14/16 1728  AST  --  32  --    --   ALT  --  22  --  28  ALKPHOS  --  153*  --   --   BILITOT  --  0.7  --   --   ALBUMIN 1.3* 1.3* 1.8*  --     Cardiac Enzymes No results for input(s): TROPONINI, PROBNP in the last 168 hours.  Glucose  Recent Labs Lab 03/16/16 1542 03/16/16 1937 03/16/16 2314 03/17/16 0338 03/17/16 0757 03/17/16 1218  GLUCAP 152* 115* 121* 140* 129* 145*    ASSESSMENT / PLAN:  PULMONARY A: Acute Hypoxemic Hypercapneic resp fx 2/2 Empyema, B, with Citrobacter in pleural fluid (11/6) with Multilobar PNA S/P Chest tube R and L, 11/18, with small apical PTX, bilateral.  H/o tobacco abuse, likely COPD  PRCV to PCV on 11/23. P:   Continue vent. ABG reviewed SAT/SBT as able. Anticipate terminal wean Monday Chest tubes to water . Drained 850 cc/24hr.  On meropenem 11/17>>   CARDIOVASCULAR A:  H/o HTN  Echo 11/20 with EF of 60-65%, mod LVH and PAPP 51 History of UE DVT. LE doppler 11/20 without DVT Hypotension. Off pressor 11/19 P:  S/p 12.5 mg Albumin 4 doses on 11/20 KVO Levophed as needed  for MAP < 65. Off this since 11/19  RENAL A:   AKI on CKD 2/2 sepsis/hypovolemia Non-anion gap metabolic acidosis: resolved BPH AoCKD st 3A (Creat 1.3 in 10/2015) Anuria. UOP 125cc/+1L/+8.5L. But patient on HD P:   Off lasix. 8.5L (+) but on HD Appreciate renal recs HD prn now. Agee. No significant change since HD  GASTROINTESTINAL A:   Recent emesis.  No BM yet P:   IV Pepcid  Cont TF.  Observe BM > if (-) will need smog KUB > no obsturction   HEMATOLOGIC A:   Acute anemia, s/p 1u PRBC with appropriate response (admission)  Hgb stable P:  Monitor CBC  Transfuse HgB <7  INFECTIOUS A:   Multilobar PNA / Empyema 2/2 to Citrobacter.  Fever curve trending down. Last fever 11/23 at 11am Repeat blood cultures negative CXR worsening opacities bilaterally P:   Cont Meropenem.  Chest tube drainage. Wife does not want heroics, VTS etc.   ENDOCRINE A:   T2DM,  controlled P:   Sensitive SSI   NEUROLOGIC A:   H/o MS  Recent CVA (10/17)  Sedated  P:   RASS 0 to -1  Fentanyl gtt + PRN Versed pushes as needed   WUA  Appreciate Palliative Care recs.   FAMILY  - Updates: wife updated at bedside 11/22  - Inter-disciplinary family meet or Palliative Care meeting due by: 11/24     I have spent 30  minutes of critical care time with this patient today.  Family I EXTENSIVELY D/W WIFE REGARDING OVER ALL POOR PROGNOSIS. WE DECIDED TO WAIT UNTIL Monday >>> IF not better, wife will make him comfort care.   Wife DOES NOT WANT ANY INVASIVE procedure (like chest tube) or HIGH DOSE PRESSORS.    Pollie Meyer, MD 03/17/2016, 1:11 PM [facility] Pager [phone] After 3 pm or if no answer, call [phone]

## 2016-03-17 NOTE — Progress Notes (Signed)
Stopped by to see pt and wife. Pt's sedation decreased. Wife feels he now knows she is at the bedside, will squeeze her hand to command. Discussed causes of renal failure as well as pt's low albumin. She did inquire as to how someone is removed from life support and I did review how medications are placed for comfort, probably dilaudid and versed. She was familiar with these. Plan is still in place for watchful waiting thru the weekend. Cont HD for now. Continue DNR. Would not want trach/PEC or HD long term Eduard Roux, ANP

## 2016-03-18 LAB — GLUCOSE, CAPILLARY
GLUCOSE-CAPILLARY: 126 mg/dL — AB (ref 65–99)
GLUCOSE-CAPILLARY: 133 mg/dL — AB (ref 65–99)
Glucose-Capillary: 127 mg/dL — ABNORMAL HIGH (ref 65–99)
Glucose-Capillary: 134 mg/dL — ABNORMAL HIGH (ref 65–99)
Glucose-Capillary: 143 mg/dL — ABNORMAL HIGH (ref 65–99)
Glucose-Capillary: 143 mg/dL — ABNORMAL HIGH (ref 65–99)
Glucose-Capillary: 145 mg/dL — ABNORMAL HIGH (ref 65–99)

## 2016-03-18 LAB — BASIC METABOLIC PANEL
ANION GAP: 15 (ref 5–15)
Anion gap: 13 (ref 5–15)
BUN: 97 mg/dL — ABNORMAL HIGH (ref 6–20)
BUN: 103 mg/dL — ABNORMAL HIGH (ref 6–20)
CALCIUM: 8.1 mg/dL — AB (ref 8.9–10.3)
CALCIUM: 7.9 mg/dL — AB (ref 8.9–10.3)
CO2: 23 mmol/L (ref 22–32)
CO2: 23 mmol/L (ref 22–32)
CREATININE: 8.65 mg/dL — AB (ref 0.61–1.24)
Chloride: 96 mmol/L — ABNORMAL LOW (ref 101–111)
Chloride: 98 mmol/L — ABNORMAL LOW (ref 101–111)
Creatinine, Ser: 8.18 mg/dL — ABNORMAL HIGH (ref 0.61–1.24)
GFR calc non Af Amer: 6 mL/min — ABNORMAL LOW (ref >60)
GFR, EST AFRICAN AMERICAN: 7 mL/min — AB (ref >60)
GFR, EST AFRICAN AMERICAN: 6 mL/min — AB (ref >60)
GFR, EST NON AFRICAN AMERICAN: 6 mL/min — AB (ref >60)
GLUCOSE: 141 mg/dL — AB (ref 65–99)
GLUCOSE: 154 mg/dL — AB (ref 65–99)
POTASSIUM: 5.8 mmol/L — AB (ref 3.5–5.1)
Potassium: 5.6 mmol/L — ABNORMAL HIGH (ref 3.5–5.1)
Sodium: 134 mmol/L — ABNORMAL LOW (ref 135–145)
Sodium: 134 mmol/L — ABNORMAL LOW (ref 135–145)

## 2016-03-18 LAB — PHOSPHORUS: PHOSPHORUS: 9.5 mg/dL — AB (ref 2.5–4.6)

## 2016-03-18 LAB — CBC
HEMATOCRIT: 27 % — AB (ref 39.0–52.0)
HEMOGLOBIN: 8.9 g/dL — AB (ref 13.0–17.0)
MCH: 27.2 pg (ref 26.0–34.0)
MCHC: 33 g/dL (ref 30.0–36.0)
MCV: 82.6 fL (ref 78.0–100.0)
Platelets: 228 10*3/uL (ref 150–400)
RBC: 3.27 MIL/uL — AB (ref 4.22–5.81)
RDW: 16.5 % — ABNORMAL HIGH (ref 11.5–15.5)
WBC: 14 10*3/uL — ABNORMAL HIGH (ref 4.0–10.5)

## 2016-03-18 LAB — MAGNESIUM: MAGNESIUM: 2.5 mg/dL — AB (ref 1.7–2.4)

## 2016-03-18 MED ORDER — SODIUM POLYSTYRENE SULFONATE 15 GM/60ML PO SUSP
30.0000 g | Freq: Once | ORAL | Status: AC
  Administered 2016-03-18: 30 g
  Filled 2016-03-18: qty 120

## 2016-03-18 NOTE — Progress Notes (Signed)
PULMONARY / CRITICAL CARE MEDICINE   Name: Duane Harper MRN: [id number] DOB: 21-Jan-1946    ADMISSION DATE:  03/08/2016  REFERRING MD:  [facility]   CHIEF COMPLAINT:  Respiratory Failure   Brief:  History obtained from wife and records review as patient is sedated/intubated.   Duane Harper is 70 y.o. 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 Had left sided deficits from this CVA as well as cognitive changes. While undergoing rehab for his CVA, he developed dyspnea. Was diagnosed with pneumonia and bilateral pleural effusions. Patient was started on Levaquin and required supplemental O2. Patient's pleural fluid isolated Citrobacter. Fluid analysis suggested transudate process. Patient was treated with Lasix and antibiotics were broadened.  On 11/13, patient had a syncopal episode while participating in PT. He was found to be hypotensive at that time. Patient continued to remain febrile with worsening hypoxia despite supplemental O2 and abx coverage. CTA chest obtained which was negative for PE and slight progression of severe multilobar PNA compared to prior CT.   On 11/14, patient stated he desired intubation as his WOB was significant on BiPAP. Bronchoscopy was performed which showed no endobronchial lesions and minimal inflammation of the bronchi in the left upper lobe. Bronchial cultures showed normal oral respiratory flora.   Had discussion with wife regarding patient's wishes. She reports that he would not want to remain on ventilator for long term. He would not wish to have a tracheostomy or a PEG tube. His wish would be to be a DNR. He would not want chest compressions or defibrillation.   Imaging DG chest 11/20:  Fairly stable appearance of the chest. Small right apical pneumothorax. Persistent bilateral lateral upper lobe infiltrates with mild bibasilar atelectasis or infiltrate. The support tubes are in reasonable position.  11/21: Tube and  catheter positions are unchanged. Small apical pneumothorax on each side persist. No tension component. Airspace consolidation in both upper lobes and left base are stable. No new opacity. Stable cardiac silhouette. There is aortic atherosclerosis.   11/22: Persistent small pneumothoraces with bilateral upper lobe infiltrates stable from the prior exam.   11/24: Worsening opacity bilaterally likely congestion vs infiltration. Unchanged small bilateral pneumothoraces.  STUDIES:  CT chest 11/18>>>Extensive bilateral pulmonary opacities worrisome for an inflammatory process, ARDS, or edema are stable. Malignancy is not excluded.Moderate bilateral pleural effusions have increased.  CULTURES: 11/6 pleural fluid L with Citrobacter sensitive to Cefepime, Meropenem. Resistant to cefazolin 11/18 rt effusion>>>NGTD 11/18 left effusion>>>NGTD 11/17 BC>>>NGTD 11/17 sputum>>>Normal flora 11/22 Urine >> 11/22 sputum >Pending. GS with rare yeast 11/23 blood > NGTD   ANTIBIOTICS: Vancomycin 11/17 >> 11/20 Meropenem 11/17 >>   LINES/TUBES: ETT 11/14 >>  Right Femoral 11/17 >>11/18 Left IJ 11/18>>> Rt chest tuibe 11/18>>> Left chest tube 11/18>>> Foley 11/15>> HD cath in Rt IJ 11/22>>  SIGNIFICANT EVENTS: 11/1: Admitted to [facility] for hypoxia and dyspnea, found to have multilobar PNA and pleural effusions  11/6: Thoracentesis performed; culture grew Citrobacter  11/13: CTA chest negative for PE  11/4: Patient intubated  11/17: Patient started on Levophed, transferred to [facility]  11/18- Chest tubes placed, came off pressors, line neck placed, fem dc'ed 11/20 - AKI. Nephro consulted 11/21- Anuria with worsening renal function 11/22-Spiked mild fever. Pan cultured. HD started  11/23- changed from [facility] to PCV due to dyssynchrony with vent.   SUBJECTIVE:  Slightly opens eyes in response to his name. No clinical improvement. Remains oliguric.  VITAL SIGNS: BP (!) 143/62    Pulse 96   Temp 99.8 F (37.7 C) (Oral)   Resp (!) 22   Ht 5\' 9"  (1.753 m)   Wt 88.7 kg (195 lb 8.8 oz)   SpO2 97%   BMI 28.88 kg/m   HEMODYNAMICS: CVP:  [5 mmHg-8 mmHg] 8 mmHg  VENTILATOR SETTINGS: Vent Mode: PCV FiO2 (%):  [40 %] 40 % Set Rate:  [20 bmp] 20 bmp PEEP:  [8 cmH20-10 cmH20] 8 cmH20 Plateau Pressure:  [23 cmH20-30 cmH20] 27 cmH20  INTAKE / OUTPUT: I/O last 3 completed shifts: In: 2920.5 [I.V.:680.5; NG/GT:2190; IV Piggyback:50] Out: 1145 [Urine:115; Chest Tube:1030]  PHYSICAL EXAMINATION: General: sick, some grimace to deep sternal rub Neuro:  Opens eyes in response to pain, doesn't follow command, RASS -2 HEENT:  ETT in place,  Mildly disynchronous with vent Cardiovascular:  RRR s1 s2  Lungs:  Coarse breath sounds bilaterally, no wheeze or crackles. Abdomen:  Soft, non-distended , no r/g Musculoskeletal:  Gr 2 edema in UE's Skin:  No rashes on exposed skin.   LABS:  BMET  Recent Labs Lab 03/17/16 0500 03/18/16 0430 03/18/16 1200  NA 136 134* 134*  K 4.9 5.8* 5.6*  CL 99* 96* 98*  CO2 26 23 23   BUN 74* 97* 103*  CREATININE 7.54* 8.18* 8.65*  GLUCOSE 125* 141* 154*    Electrolytes  Recent Labs Lab 03/16/16 0420 03/17/16 0500 03/18/16 0430 03/18/16 1200  CALCIUM 7.8* 7.9* 8.1* 7.9*  MG 2.6* 2.3 2.5*  --   PHOS 9.9* 8.4* 9.5*  --     CBC  Recent Labs Lab 03/16/16 0420 03/17/16 0500 03/18/16 0430  WBC 8.0 9.4 14.0*  HGB 7.3* 8.5* 8.9*  HCT 22.4* 26.0* 27.0*  PLT 176 181 228    Coag's No results for input(s): APTT, INR in the last 168 hours.  Sepsis Markers  Recent Labs Lab 03/15/16 1126 03/16/16 0420 03/17/16 0500  PROCALCITON 1.54 1.46 1.48    ABG  Recent Labs Lab 03/14/16 0702 03/15/16 1654  PHART 7.349* 7.393  PCO2ART 38.7 37.4  PO2ART 113* 103    Liver Enzymes  Recent Labs Lab 03/12/16 0217 03/14/16 1702 03/14/16 1728  AST 32  --   --   ALT 22  --  28  ALKPHOS 153*  --   --   BILITOT 0.7  --    --   ALBUMIN 1.3* 1.8*  --     Cardiac Enzymes No results for input(s): TROPONINI, PROBNP in the last 168 hours.  Glucose  Recent Labs Lab 03/17/16 2016 03/18/16 0021 03/18/16 0510 03/18/16 0809 03/18/16 1133 03/18/16 1704  GLUCAP 156* 143* 126* 145* 143* 134*    ASSESSMENT / PLAN:  PULMONARY A: Acute Hypoxemic Hypercapneic resp fx 2/2 Empyema, B, with Citrobacter in pleural fluid (11/6) with Multilobar PNA S/P Chest tube R and L, 11/18, with small apical PTX, bilateral.  H/o tobacco abuse, likely COPD  PRCV to PCV on 11/23. P:   Continue vent. ABG reviewed SAT/SBT as able. Anticipate terminal wean Monday Chest tubes to water . Drained 850 cc/24hr.  On meropenem 11/17>>   CARDIOVASCULAR A:  H/o HTN  Echo 11/20 with EF of 60-65%, mod LVH and PAPP 51 History of UE DVT. LE doppler 11/20 without DVT Hypotension. Off pressor 11/19 P:  S/p 12.5 mg Albumin 4 doses on 11/20 KVO Levophed as needed for MAP < 65. Off this since 11/19  RENAL A:   AKI on CKD  2/2 sepsis/hypovolemia Non-anion gap metabolic acidosis: resolved BPH AoCKD st 3A (Creat 1.3 in 10/2015) Anuria. UOP 125cc/+1L/+8.5L. But patient on HD P:   Off lasix. 8.5L (+) but on HD Appreciate renal recs HD prn now. Agee. No significant change since HD  GASTROINTESTINAL A:   Recent emesis.  No BM yet P:   IV Pepcid  Cont TF.  Observe BM > if (-) will need smog KUB > no obsturction   HEMATOLOGIC A:   Acute anemia, s/p 1u PRBC with appropriate response (admission)  Hgb stable P:  Monitor CBC  Transfuse HgB <7  INFECTIOUS A:   Multilobar PNA / Empyema 2/2 to Citrobacter.  Fever curve trending down. Last fever 11/23 at 11am Repeat blood cultures negative CXR worsening opacities bilaterally P:   Cont Meropenem.  Chest tube drainage. Wife does not want heroics, VTS etc.   ENDOCRINE A:   T2DM, controlled P:   Sensitive SSI   NEUROLOGIC A:   H/o MS  Recent CVA (10/17)  Sedated   P:   RASS 0 to -1  Fentanyl gtt + PRN Versed pushes as needed   WUA  Appreciate Palliative Care recs.   FAMILY  - Updates: wife updated at bedside 11/22  - Inter-disciplinary family meet or Palliative Care meeting due by: 11/24     I have spent 30  minutes of critical care time with this patient today.  Family I EXTENSIVELY D/W WIFE REGARDING OVER ALL POOR PROGNOSIS. WE DECIDED TO WAIT UNTIL Monday >>> IF not better, wife will make him comfort care.   Wife DOES NOT WANT ANY INVASIVE procedure (like chest tube) or HIGH DOSE PRESSORS. Likely terminal wean on moday   J. Alexis Frock, MD 03/18/2016, 7:34 PM [place] [facility] Pager [phone] After 3 pm or if no answer, call [phone]

## 2016-03-18 NOTE — Progress Notes (Signed)
                                                                                                                                                                                                         Daily Progress Note   Patient Name: Duane Harper       Date: 03/18/2016 DOB: 06/18/1945  Age: 70 y.o. MRN#: [id number] Attending Physician: Praveen Mannam, MD Primary Care Physician: SCHULTZ,DOUGLAS E, MD Admit Date: 03/01/2016  Reason for Consultation/Follow-up: Establishing goals of care, Psychosocial/spiritual support, Terminal Care and Withdrawal of life-sustaining treatment  Subjective: Patient is day 13 on the event. He is breathing above the vent, but has copious secretions. His wife is at the bedside. She is very tearful, "isn't on any way we can give him more time?" . We discussed again that more time could be provided but that would require a trach and a PEG. Wife was able to review again his wishes were specifically no trach no PEG and no hemodialysis. She states he would never want to live in a nursing home and these aggressive measures would entail that he be in a nursing home. I supported her own selfish decision. I encouraged her to go ahead and tell people that want to be present to go ahead and, so that when she is ready tomorrow she is not waiting for people out of town. He does have a daughter who will be one of those that are coming. Everyone of their friends and family,  all have the consensus that he would not want to live in a nursing home, with a trach, with a PEG or on hemodialysis and support this  decision  Length of Stay: 9  Current Medications: Scheduled Meds:  . chlorhexidine gluconate (MEDLINE KIT)  15 mL Mouth Rinse BID  . famotidine  20 mg Per Tube QHS  . heparin subcutaneous  5,000 Units Subcutaneous Q8H  . insulin aspart  1-3 Units Subcutaneous Q4H  . ipratropium-albuterol  3 mL Nebulization Q6H  . mouth rinse  15 mL Mouth Rinse 10 times per day  .  meropenem (MERREM) IV  500 mg Intravenous Daily  . polyethylene glycol  17 g Oral BID    Continuous Infusions: . feeding supplement (VITAL AF 1.2 CAL) 1,000 mL (03/17/16 2100)  . fentaNYL infusion INTRAVENOUS 100 mcg/hr (03/18/16 0000)    PRN Meds: sodium chloride, sodium chloride, sodium chloride, acetaminophen, albuterol, alteplase, bisacodyl, fentaNYL (SUBLIMAZE) injection, heparin, lidocaine (PF), lidocaine-prilocaine, midazolam, pentafluoroprop-tetrafluoroeth  Physical Exam  Constitutional: He appears well-developed and well-nourished.  HENT:    Head: Normocephalic and atraumatic.  Pulmonary/Chest:  On vent. Breathing above the vent this am. Thick secretions  Genitourinary:  Genitourinary Comments: Foley; rectal tube  Neurological:  Minimally responsive. Sedation has been decreased. Has squeezed his wife's hand  Skin: Skin is warm and dry.  Psychiatric:  Unable to test. No agitation  Nursing note and vitals reviewed.           Vital Signs: BP (!) 127/59   Pulse 89   Temp 99.9 F (37.7 C) (Oral)   Resp 16   Ht 5' 9" (1.753 m)   Wt 88.7 kg (195 lb 8.8 oz)   SpO2 98%   BMI 28.88 kg/m  SpO2: SpO2: 98 % O2 Device: O2 Device: Ventilator O2 Flow Rate:    Intake/output summary:  Intake/Output Summary (Last 24 hours) at 03/18/16 1028 Last data filed at 03/18/16 0900  Gross per 24 hour  Intake          1836.96 ml  Output              825 ml  Net          1011.96 ml   LBM: Last BM Date: 03/17/16 Baseline Weight: Weight: 83.3 kg (183 lb 10.3 oz) Most recent weight: Weight: 88.7 kg (195 lb 8.8 oz)       Palliative Assessment/Data:    Flowsheet Rows   Flowsheet Row Most Recent Value  Intake Tab  Referral Department  Hospitalist  Unit at Time of Referral  ICU  Palliative Care Primary Diagnosis  Sepsis/Infectious Disease  Date Notified  03/16/16  Palliative Care Type  New Palliative care  Reason for referral  Clarify Goals of Care, Psychosocial or Spiritual  support  Date of Admission  03/02/16  Date first seen by Palliative Care  03/16/16  # of days Palliative referral response time  0 Day(s)  # of days IP prior to Palliative referral  14  Clinical Assessment  Palliative Performance Scale Score  20%  Pain Max last 24 hours  Not able to report  Pain Min Last 24 hours  Not able to report  Dyspnea Max Last 24 Hours  Not able to report  Dyspnea Min Last 24 hours  Not able to report  Nausea Max Last 24 Hours  Not able to report  Nausea Min Last 24 Hours  Not able to report  Anxiety Max Last 24 Hours  Not able to report  Anxiety Min Last 24 Hours  Not able to report  Other Max Last 24 Hours  Not able to report  Psychosocial & Spiritual Assessment  Palliative Care Outcomes  Patient/Family meeting held?  Yes  Who was at the meeting?  wife  Palliative Care Outcomes  Clarified goals of care  Patient/Family wishes: Interventions discontinued/not started   PEG, Trach  Palliative Care follow-up planned  Yes, Facility      Patient Active Problem List   Diagnosis Date Noted  . Pressure injury of skin 03/16/2016  . Palliative care encounter   . AKI (acute kidney injury) ([place])   . Empyema ([place])   . Encounter for chest tube placement   . Encounter for central line care   . Acute respiratory failure ([place]) 02/28/2016    Palliative Care Assessment & Plan   Patient Profile: 70 y.o. 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 He was diagnosed with pneumonia and bilateral pleural effusions. On 03/05/2016 patient had a syncopal episode while participating in physical therapy . He was found to be hypotensive at the time, became febrile despite antibiotic coverage. CTA of the chest revealed no PE but  progression of multi lobar pneumonia compared to prior CT. On 11/14, patient required intubation secondary to his increased work of breathing. A . On 03/10/2016 chest tubes were placed, he came off pressors. He now is in acute kidney injury and is undergoing hemodialysis; now anuric. He is still intubated. Wife is at his bedside   Recommendations/Plan:  Palliative medicine team to stay involved. Plan to round on patient first thing in the morning to ascertain when we will proceed with terminal wean.  Goals of Care and Additional Recommendations:  Limitations on Scope of Treatment: No Artificial Feeding, No Chemotherapy, No Hemodialysis, No Surgical Procedures and No Tracheostomy  Code Status:    Code Status Orders        Start     Ordered   02/22/2016 2136  Do not attempt resuscitation (DNR)  Continuous    Question Answer Comment  In the event of cardiac or respiratory ARREST Do not call a "code blue"   In the event of cardiac or respiratory ARREST Do not perform Intubation, CPR, defibrillation or ACLS   In the event of cardiac or respiratory ARREST Use medication by any route, position, wound care, and other measures to relive pain and suffering. May use oxygen, suction and manual treatment of airway obstruction as needed for comfort.      02/22/2016 2139    Code Status History    Date Active Date Inactive Code Status Order ID Comments User Context   This patient has a current code status but no historical code status.       Prognosis:   Hours - Days after one way extubation. I did prepare his wife that sometimes people do surprise [place] and live longer, but she is prepared for him to pass quickly once the ventilators removed  Discharge Planning:  Anticipated Hospital Death  Thank you for allowing the Palliative Medicine Team to assist in the care of this patient.   Time In: 1000 Time Out: 1035 Total Time 35 min Prolonged Time Billed  no       Greater than 50%  of this time  was spent counseling and coordinating care related to the above assessment and plan.  Dory Horn, NP  Please contact Palliative Medicine Team phone at [phone] for questions and concerns.

## 2016-03-18 NOTE — Progress Notes (Signed)
Assessment:  1 AKI, hemodynamically mediated. Oliguric (creat 1.37 on 11/01/15), 1st HD on 11/22, last HD 11/24 2CVA 3VDRF 4PNA/empyema/CT drainage 5 Hyperkalemia, mild Plan: Kayexalate,  HD prn   Objective: Vital signs in last 24 hours: Temp:  [99.2 F (37.3 C)-101.9 F (38.8 C)] 99.5 F (37.5 C) (11/26 0511) Pulse Rate:  [88-115] 92 (11/26 0700) Resp:  [11-30] 18 (11/26 0700) BP: (119-163)/(52-80) 124/54 (11/26 0700) SpO2:  [93 %-99 %] 97 % (11/26 0700) FiO2 (%):  [40 %] 40 % (11/26 0400) Weight:  [88.7 kg (195 lb 8.8 oz)] 88.7 kg (195 lb 8.8 oz) (11/26 0100) Weight change: -0.5 kg (-1 lb 1.6 oz)  Intake/Output from previous day: 11/25 0701 - 11/26 0700 In: 2045.5 [I.V.:465.5; NG/GT:1530; IV Piggyback:50] Out: 891 [Urine:115; Chest Tube:710] Intake/Output this shift: No intake/output data recorded.  unresponsive on ventilator no obvious change Is sedated   Lab Results:  Recent Labs  03/17/16 0500 03/18/16 0430  WBC 9.4 14.0*  HGB 8.5* 8.9*  HCT 26.0* 27.0*  PLT 181 228   BMET:  Recent Labs  03/17/16 0500 03/18/16 0430  NA 136 134*  K 4.9 5.8*  CL 99* 96*  CO2 26 23  GLUCOSE 125* 141*  BUN 74* 97*  CREATININE 7.54* 8.18*  CALCIUM 7.9* 8.1*   No results for input(s): PTH in the last 72 hours. Iron Studies: No results for input(s): IRON, TIBC, TRANSFERRIN, FERRITIN in the last 72 hours. Studies/Results: Dg Chest Port 1 View  Result Date: 03/17/2016 CLINICAL DATA:  Respiratory failure EXAM: PORTABLE CHEST 1 VIEW COMPARISON:  03/19/2016 FINDINGS: Cardiac shadow is stable. Endotracheal tube, nasogastric catheter, left jugular and right jugular central lines are again seen and stable. The small pneumothorax seen on the right is again identified and stable. The left pneumothorax has resolved in the interval. Biapical infiltrates are seen. Left basilar infiltrate is noted as well. Bilateral chest tubes are again seen. No new focal abnormality is seen.  IMPRESSION: Resolution of left pneumothorax. Persistent right pneumothorax and biapical infiltrates. Persistent left basilar changes are noted. Electronically Signed   By: Inez Catalina M.D.   On: 03/17/2016 07:32    Scheduled: . chlorhexidine gluconate (MEDLINE KIT)  15 mL Mouth Rinse BID  . famotidine  20 mg Per Tube QHS  . heparin subcutaneous  5,000 Units Subcutaneous Q8H  . insulin aspart  1-3 Units Subcutaneous Q4H  . ipratropium-albuterol  3 mL Nebulization Q6H  . mouth rinse  15 mL Mouth Rinse 10 times per day  . meropenem (MERREM) IV  500 mg Intravenous Daily  . polyethylene glycol  17 g Oral BID    LOS: 9 days   Kaetlin Bullen C 03/18/2016,7:46 AM

## 2016-03-19 DIAGNOSIS — Z66 Do not resuscitate: Secondary | ICD-10-CM

## 2016-03-19 LAB — BASIC METABOLIC PANEL
Anion gap: 14 (ref 5–15)
BUN: 116 mg/dL — ABNORMAL HIGH (ref 6–20)
CHLORIDE: 100 mmol/L — AB (ref 101–111)
CO2: 23 mmol/L (ref 22–32)
Calcium: 7.9 mg/dL — ABNORMAL LOW (ref 8.9–10.3)
Creatinine, Ser: 9.21 mg/dL — ABNORMAL HIGH (ref 0.61–1.24)
GFR calc non Af Amer: 5 mL/min — ABNORMAL LOW (ref >60)
GFR, EST AFRICAN AMERICAN: 6 mL/min — AB (ref >60)
Glucose, Bld: 134 mg/dL — ABNORMAL HIGH (ref 65–99)
POTASSIUM: 5.4 mmol/L — AB (ref 3.5–5.1)
SODIUM: 137 mmol/L (ref 135–145)

## 2016-03-19 LAB — CBC
HEMATOCRIT: 25.3 % — AB (ref 39.0–52.0)
Hemoglobin: 8.4 g/dL — ABNORMAL LOW (ref 13.0–17.0)
MCH: 27.5 pg (ref 26.0–34.0)
MCHC: 33.2 g/dL (ref 30.0–36.0)
MCV: 82.7 fL (ref 78.0–100.0)
PLATELETS: 255 10*3/uL (ref 150–400)
RBC: 3.06 MIL/uL — AB (ref 4.22–5.81)
RDW: 16.5 % — ABNORMAL HIGH (ref 11.5–15.5)
WBC: 10.2 10*3/uL (ref 4.0–10.5)

## 2016-03-19 LAB — GLUCOSE, CAPILLARY
GLUCOSE-CAPILLARY: 127 mg/dL — AB (ref 65–99)
GLUCOSE-CAPILLARY: 125 mg/dL — AB (ref 65–99)
GLUCOSE-CAPILLARY: 114 mg/dL — AB (ref 65–99)
Glucose-Capillary: 131 mg/dL — ABNORMAL HIGH (ref 65–99)
Glucose-Capillary: 113 mg/dL — ABNORMAL HIGH (ref 65–99)

## 2016-03-19 LAB — TRIGLYCERIDES: TRIGLYCERIDES: 140 mg/dL (ref <150)

## 2016-03-19 LAB — MAGNESIUM: Magnesium: 2.6 mg/dL — ABNORMAL HIGH (ref 1.7–2.4)

## 2016-03-19 LAB — PHOSPHORUS: PHOSPHORUS: 11.6 mg/dL — AB (ref 2.5–4.6)

## 2016-03-19 MED ORDER — LORAZEPAM 2 MG/ML IJ SOLN: 1.0000 mg | INTRAMUSCULAR | Status: DC | PRN

## 2016-03-19 MED ORDER — LORAZEPAM 2 MG/ML IJ SOLN
1.0000 mg | INTRAMUSCULAR | Status: DC | PRN
  Administered 2016-03-20: 1 mg via INTRAVENOUS
  Filled 2016-03-19: qty 1

## 2016-03-19 MED ORDER — GLYCOPYRROLATE 0.2 MG/ML IJ SOLN
0.4000 mg | Freq: Three times a day (TID) | INTRAMUSCULAR | Status: DC
  Administered 2016-03-19 – 2016-03-24 (×16): 0.4 mg via INTRAVENOUS
  Filled 2016-03-19 (×18): qty 2

## 2016-03-19 MED ORDER — FENTANYL CITRATE (PF) 100 MCG/2ML IJ SOLN
50.0000 ug | INTRAMUSCULAR | Status: DC | PRN
  Administered 2016-03-19 – 2016-03-20 (×6): 50 ug via INTRAVENOUS
  Filled 2016-03-19 (×6): qty 2

## 2016-03-19 MED ORDER — ACETAMINOPHEN 650 MG RE SUPP
650.0000 mg | RECTAL | Status: DC | PRN
  Administered 2016-03-20: 650 mg via RECTAL
  Filled 2016-03-19: qty 1

## 2016-03-19 NOTE — Progress Notes (Signed)
Daily Progress Note   Patient Name: Duane Harper       Date: 03/19/2016 DOB: 1945-10-20  Age: 70 y.o. MRN#: [id number] Attending Physician: Marshell Garfinkel, MD Primary Care Physician: Nicoletta Dress, MD Admit Date: 03/19/2016  Reason for Consultation/Follow-up: Establishing goals of care, Psychosocial/spiritual support, Terminal Care and Withdrawal of life-sustaining treatment  Subjective: Patient is day 14 on the event.   -continued conversation with wife and step-daughter and several family friends regarding diagnosis, prognosis, GOC , options  -wife reiterates his wishes were specifically no trach no PEG and no hemodialysis.  -all friends and family support liberating the patient from the vent, no reintubation  and allowing a natural course; they hold on to the hope that if it is meant to be he will improve.  -plan is to one way extubate this afternoon after daughter from [place] get her.  Dr Larkin Ina discussed this plan with the family If the patient worsens, shift to a comfort focused plan of care  Length of Stay: 10  Current Medications: Scheduled Meds:  . chlorhexidine gluconate (MEDLINE KIT)  15 mL Mouth Rinse BID  . famotidine  20 mg Per Tube QHS  . heparin subcutaneous  5,000 Units Subcutaneous Q8H  . insulin aspart  1-3 Units Subcutaneous Q4H  . ipratropium-albuterol  3 mL Nebulization Q6H  . mouth rinse  15 mL Mouth Rinse 10 times per day  . polyethylene glycol  17 g Oral BID    Continuous Infusions: . feeding supplement (VITAL AF 1.2 CAL) 1,000 mL (03/18/16 1553)  . fentaNYL infusion INTRAVENOUS 100 mcg/hr (03/18/16 1900)    PRN Meds: sodium chloride, sodium chloride, sodium chloride, acetaminophen, albuterol, alteplase, bisacodyl, fentaNYL (SUBLIMAZE)  injection, heparin, lidocaine (PF), lidocaine-prilocaine, midazolam, pentafluoroprop-tetrafluoroeth  Physical Exam  Constitutional: He appears well-developed. He is intubated.  Cardiovascular: Tachycardia present.   Pulmonary/Chest: He is intubated.  On vent. Breathing above the vent this am. Thick secretions  Genitourinary:  Genitourinary Comments: Foley; rectal tube  Neurological:  Minimally responsive.   Not following commands  Skin: Skin is warm and dry.  Nursing note and vitals reviewed.           Vital Signs: BP (!) 146/66   Pulse 97   Temp 98.7 F (37.1 C) (Oral)   Resp 20   Ht 5' 9"  (1.753  m)   Wt 88.7 kg (195 lb 8.8 oz)   SpO2 98%   BMI 28.88 kg/m  SpO2: SpO2: 98 % O2 Device: O2 Device: Ventilator O2 Flow Rate:    Intake/output summary:   Intake/Output Summary (Last 24 hours) at 03/19/16 1124 Last data filed at 03/19/16 1100  Gross per 24 hour  Intake             1885 ml  Output              796 ml  Net             1089 ml   LBM: Last BM Date: 03/18/16 Baseline Weight: Weight: 83.3 kg (183 lb 10.3 oz) Most recent weight: Weight: 88.7 kg (195 lb 8.8 oz)       Palliative Assessment/Data:    Flowsheet Rows   Flowsheet Row Most Recent Value  Intake Tab  Referral Department  Hospitalist  Unit at Time of Referral  ICU  Palliative Care Primary Diagnosis  Sepsis/Infectious Disease  Date Notified  03/16/16  Palliative Care Type  New Palliative care  Reason for referral  Clarify Goals of Care, Psychosocial or Spiritual support  Date of Admission  03/02/16  Date first seen by Palliative Care  03/16/16  # of days Palliative referral response time  0 Day(s)  # of days IP prior to Palliative referral  14  Clinical Assessment  Palliative Performance Scale Score  20%  Pain Max last 24 hours  Not able to report  Pain Min Last 24 hours  Not able to report  Dyspnea Max Last 24 Hours  Not able to report  Dyspnea Min Last 24 hours  Not able to report  Nausea Max  Last 24 Hours  Not able to report  Nausea Min Last 24 Hours  Not able to report  Anxiety Max Last 24 Hours  Not able to report  Anxiety Min Last 24 Hours  Not able to report  Other Max Last 24 Hours  Not able to report  Psychosocial & Spiritual Assessment  Palliative Care Outcomes  Patient/Family meeting held?  Yes  Who was at the meeting?  wife  Palliative Care Outcomes  Clarified goals of care  Patient/Family wishes: Interventions discontinued/not started   PEG, Trach  Palliative Care follow-up planned  Yes, Facility      Patient Active Problem List   Diagnosis Date Noted  . Pressure injury of skin 03/16/2016  . Palliative care encounter   . AKI (acute kidney injury) ([place])   . Empyema ([place])   . Encounter for chest tube placement   . Encounter for central line care   . Acute respiratory failure with hypoxia ([place]) 03/08/2016    Palliative Care Assessment & Plan   Patient Profile: 70 y.o. 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   He was diagnosed with pneumonia and bilateral pleural effusions. On 03/05/2016 patient had a syncopal episode while participating in physical therapy .  He was found to be hypotensive at the time, became febrile despite antibiotic coverage.   CTA of the chest revealed no PE but progression of multi lobar pneumonia compared to prior CT. On 11/14, patient required intubation secondary to his increased work of breathing. A . On 03/10/2016 chest tubes were placed, he  came off pressors. He now is in acute kidney injury and is undergoing hemodialysis; now anuric. He is still intubated. Wife is at his bedside  Family faced advanced directive decisions and anticipatory care needs.     Recommendations/Plan:  -One way extubation this afternoon, family is hopeful for stabilization but realistic in the high risk of decompensation -If patient worsens shift focus to full comfort, and no further dialysis   Goals of Care and Additional Recommendations:  Limitations on Scope of Treatment: No Artificial Feeding, No Chemotherapy, No Hemodialysis, No Surgical Procedures and No Tracheostomy  Code Status:    Code Status Orders        Start     Ordered   02/24/2016 2136  Do not attempt resuscitation (DNR)  Continuous    Question Answer Comment  In the event of cardiac or respiratory ARREST Do not call a "code blue"   In the event of cardiac or respiratory ARREST Do not perform Intubation, CPR, defibrillation or ACLS   In the event of cardiac or respiratory ARREST Use medication by any route, position, wound care, and other measures to relive pain and suffering. May use oxygen, suction and manual treatment of airway obstruction as needed for comfort.      03/05/2016 2139    Code Status History    Date Active Date Inactive Code Status Order ID Comments User Context   This patient has a current code status but no historical code status.       Prognosis:   Hours - Days after one way extubation.   Discharge Planning:  Anticipated Hospital Death  Thank you for allowing the Palliative Medicine Team to assist in the care of this patient.   Time In: 1330 Time Out: 1420 Total Time  60 min Prolonged Time Billed  no       Greater than 50%  of this time was spent counseling and coordinating care related to the above assessment and plan.  Wadie Lessen, NP  Please contact Palliative Medicine Team phone at [phone] for questions and concerns.

## 2016-03-19 NOTE — Progress Notes (Signed)
   [facility]   Pts daughter is here. Will extubate pt. He is DNR.  If he worsens, will switch to comfort care and start morphine drip.  Pollie Meyer, MD 03/19/2016, 2:51 PM [facility] Pager [phone] After 3 pm or if no answer, call [phone]

## 2016-03-19 NOTE — Progress Notes (Signed)
PULMONARY / CRITICAL CARE MEDICINE   Name: Duane Harper MRN: [id number] DOB: 04-01-1946    ADMISSION DATE:  02/24/2016  REFERRING MD:  [facility]   CHIEF COMPLAINT:  Respiratory Failure   Brief:  History obtained from wife and records review as patient is sedated/intubated.   Duane Harper is 70 y.o. 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 Had left sided deficits from this CVA as well as cognitive changes. While undergoing rehab for his CVA, he developed dyspnea. Was diagnosed with pneumonia and bilateral pleural effusions. Patient was started on Levaquin and required supplemental O2. Patient's pleural fluid isolated Citrobacter. Fluid analysis suggested transudate process. Patient was treated with Lasix and antibiotics were broadened.  On 11/13, patient had a syncopal episode while participating in PT. He was found to be hypotensive at that time. Patient continued to remain febrile with worsening hypoxia despite supplemental O2 and abx coverage. CTA chest obtained which was negative for PE and slight progression of severe multilobar PNA compared to prior CT.   On 11/14, patient stated he desired intubation as his WOB was significant on BiPAP. Bronchoscopy was performed which showed no endobronchial lesions and minimal inflammation of the bronchi in the left upper lobe. Bronchial cultures showed normal oral respiratory flora.   Had discussion with wife regarding patient's wishes. She reports that he would not want to remain on ventilator for long term. He would not wish to have a tracheostomy or a PEG tube. His wish would be to be a DNR. He would not want chest compressions or defibrillation.   Imaging DG chest 11/20:  Fairly stable appearance of the chest. Small right apical pneumothorax. Persistent bilateral lateral upper lobe infiltrates with mild bibasilar atelectasis or infiltrate. The support tubes are in reasonable position.  11/21: Tube and  catheter positions are unchanged. Small apical pneumothorax on each side persist. No tension component. Airspace consolidation in both upper lobes and left base are stable. No new opacity. Stable cardiac silhouette. There is aortic atherosclerosis.   11/22: Persistent small pneumothoraces with bilateral upper lobe infiltrates stable from the prior exam.   11/24: Worsening opacity bilaterally likely congestion vs infiltration. Unchanged small bilateral pneumothoraces.  STUDIES:  CT chest 11/18>>>Extensive bilateral pulmonary opacities worrisome for an inflammatory process, ARDS, or edema are stable. Malignancy is not excluded.Moderate bilateral pleural effusions have increased.  CULTURES: 11/6 pleural fluid L with Citrobacter sensitive to Cefepime, Meropenem. Resistant to cefazolin 11/18 rt effusion>>>NGTD 11/18 left effusion>>>NGTD 11/17 BC>>>NGTD 11/17 sputum>>>Normal flora 11/22 Urine >> 11/22 sputum >Pending. GS with rare yeast 11/23 blood > NGTD   ANTIBIOTICS: Vancomycin 11/17 >> 11/20 Meropenem 11/17 >>   LINES/TUBES: ETT 11/14 >>  Right Femoral 11/17 >>11/18 Left IJ 11/18>>> Rt chest tuibe 11/18>>> Left chest tube 11/18>>> Foley 11/15>> HD cath in Rt IJ 11/22>>  SIGNIFICANT EVENTS: 11/1: [facility] hypoxia and dyspnea, found to have multilobar PNA and pleural effusions  11/6: Thoracentesis performed; culture grew Citrobacter  11/13: CTA chest negative for PE  11/4: Patient intubated  11/17: Patient started on Levophed, [facility]  11/18- Chest tubes placed, came off pressors, line neck placed, fem dc'ed 11/20 - AKI. Nephro consulted 11/21- Anuria with worsening renal function 11/22-Spiked mild fever. Pan cultured. HD started  [facility] dyssynchrony with vent.  11/24- GOC with wife. Possible terminal wean on 03/19/2016  SUBJECTIVE:  Awake, follows commads, comfortable.  VITAL SIGNS: BP  140/62   Pulse 89   Temp 99.3 F (37.4 C) (Oral)   Resp 17   Ht 5\' 9"  (1.753 m)   Wt 195 lb 8.8 oz (88.7 kg)   SpO2 97%   BMI 28.88 kg/m   HEMODYNAMICS: CVP:  [5 mmHg-7 mmHg] 5 mmHg  VENTILATOR SETTINGS: Vent Mode: PCV FiO2 (%):  [40 %] 40 % Set Rate:  [20 bmp] 20 bmp PEEP:  [8 cmH20] 8 cmH20 Plateau Pressure:  [26 cmH20-29 cmH20] 27 cmH20  INTAKE / OUTPUT: I/O last 3 completed shifts: In: 2920.5 [I.V.:680.5; NG/GT:2190; IV Piggyback:50] Out: 1146 [Urine:115; Stool:1; Chest Tube:1030]  PHYSICAL EXAMINATION: General: chronically ill. Awake, in distress on the vent Neuro:  Awake, follows commands (simple) HEENT:  ETT in place, on PCV,   Cardiovascular:  RRR s1 s2  Lungs:  Coarse breath sounds bilaterally, no wheeze or crackles. Abdomen:  Soft, non-distended , no r/g Musculoskeletal:  Gr 2 edema in UE's, none in LE's Skin:  No rashes on exposed skin.   LABS:  BMET  Recent Labs Lab 03/18/16 0430 03/18/16 1200 03/19/16 0505  NA 134* 134* 137  K 5.8* 5.6* 5.4*  CL 96* 98* 100*  CO2 23 23 23   BUN 97* 103* 116*  CREATININE 8.18* 8.65* 9.21*  GLUCOSE 141* 154* 134*    Electrolytes  Recent Labs Lab 03/17/16 0500 03/18/16 0430 03/18/16 1200 03/19/16 0505  CALCIUM 7.9* 8.1* 7.9* 7.9*  MG 2.3 2.5*  --  2.6*  PHOS 8.4* 9.5*  --  11.6*    CBC  Recent Labs Lab 03/17/16 0500 03/18/16 0430 03/19/16 0505  WBC 9.4 14.0* 10.2  HGB 8.5* 8.9* 8.4*  HCT 26.0* 27.0* 25.3*  PLT 181 228 255    Coag's No results for input(s): APTT, INR in the last 168 hours.  Sepsis Markers  Recent Labs Lab 03/15/16 1126 03/16/16 0420 03/17/16 0500  PROCALCITON 1.54 1.46 1.48    ABG  Recent Labs Lab 03/14/16 0702 03/15/16 1654  PHART 7.349* 7.393  PCO2ART 38.7 37.4  PO2ART 113* 103    Liver Enzymes  Recent Labs Lab 03/14/16 1702 03/14/16 1728  ALT  --  28  ALBUMIN 1.8*  --     Cardiac Enzymes No results for input(s): TROPONINI, PROBNP in the last  168 hours.  Glucose  Recent Labs Lab 03/18/16 0809 03/18/16 1133 03/18/16 1704 03/18/16 2020 03/18/16 2345 03/19/16 0437  GLUCAP 145* 143* 134* 133* 127* 127*    ASSESSMENT / PLAN:  PULMONARY A: Acute Hypoxemic Hypercapneic resp fx 2/2 Empyema, B, with Citrobacter in pleural fluid (11/6) with Multilobar PNA S/P Chest tube R and L, 11/18, with small apical PTX, bilateral.  H/o tobacco abuse, likely COPD  PRCV to PCV on 11/23. P:   Extensively d/w wife and family re: pts condition and prognosis and his wishes. He does NOT want to be intubated, long term HD. Awaiting daughter to arrive on 11/27 pm from [place].  Once she gets here, will terminally extubate.  He will be full DNR now.  If he does poorly extubating, will transition to comfort care.  Chest tubes to water . Drained 670 cc/24hr.  On meropenem 11/17>>  CARDIOVASCULAR A:  H/o HTN  Echo 11/20 with EF of 60-65%, mod LVH and PAPP 51 History of UE DVT. LE doppler 11/20 without DVT Hypotension. Off pressor 11/19 P:  S/p 12.5 mg Albumin 4 doses on 11/20 KVO Levophed as needed for MAP < 65. Off this  since 11/19  RENAL A:   AKI on CKD 2/2 sepsis/hypovolemia Non-anion gap metabolic acidosis: resolved BPH AoCKD st 3A (Creat 1.3 in 10/2015) Aliguria. UOP 125cc/+0.5/+12.3L. But patient on HD P:   Off lasix. +12L. On HD but no significant change Appreciate renal recs. Holding off on HD  GASTROINTESTINAL A:   Recent emesis.  KUB > no obsturction Last BM 11/25 P:   IV Pepcid  Cont TF.  Continue miralax  HEMATOLOGIC A:   Acute anemia, s/p 1u PRBC with appropriate response (admission)  Hgb stable P:  Monitor CBC   INFECTIOUS A:   Multilobar PNA / Empyema 2/2 to Citrobacter.  Fever curve trending down. Last fever 11/23 at 11am Repeat blood cultures negative CXR worsening opacities bilaterally P:   Cont Meropenem.  Chest tube drainage. Wife does not want heroics, VTS etc.   ENDOCRINE A:   T2DM,  controlled P:   Sensitive SSI   NEUROLOGIC A:   H/o MS  Recent CVA (10/17)  Delirium, Pain P:   RASS 0 to -1 Fentanyl gtt + PRN Versed pushes as needed   WUA  Appreciate Palliative Care recs.   FAMILY  - Updates: no family at bedside.    Candelaria Stagers, MD.  03/19/16 9:16 AM PGY-2, [facility]  ATTENDING NOTE / ATTESTATION NOTE :   I have discussed the case with the resident/APP Candelaria Stagers MD  I agree with the resident/APP's  history, physical examination, assessment, and plans.    I have edited the above note and modified it according to our agreed history, physical examination, assessment and plan. Above note ammended. pls see. Plan to terminally extubate once daughter gets here. If he does poor, will switch to comfort care. No HD.   I have spent 30 minutes of critical care time with this patient today.  Family :Family updated at length today.  Discussed extensively with wife and family.  Pollie Meyer, MD 03/19/2016, 10:58 AM [facility] Pager [phone] After 3 pm or if no answer, call [phone]

## 2016-03-19 NOTE — Progress Notes (Signed)
Fentanyl 220 mls wasted and witnessed by Marcellus Scott RN

## 2016-03-19 NOTE — Care Management Important Message (Signed)
Important Message  Patient Details  Name: Vanetta MuldersCarroll W Haak MRN: [id number] Date of Birth: 04-Nov-1945   Medicare Important Message Given:  Yes    Kyla BalzarineShealy, Jaicee Michelotti Abena 03/19/2016, 11:26 AM

## 2016-03-19 NOTE — Progress Notes (Signed)
Subjective: Interval History:on vent, eyes open  Objective: Vital signs in last 24 hours: Temp:  [99 F (37.2 C)-99.9 F (37.7 C)] 99.3 F (37.4 C) (11/27 0438) Pulse Rate:  [82-106] 89 (11/27 0600) Resp:  [12-23] 17 (11/27 0600) BP: (119-168)/(54-80) 140/62 (11/27 0600) SpO2:  [94 %-99 %] 97 % (11/27 0600) FiO2 (%):  [40 %] 40 % (11/27 0400) Weight:  [88.7 kg (195 lb 8.8 oz)] 88.7 kg (195 lb 8.8 oz) (11/27 0500) Weight change: 0 kg (0 lb)  Intake/Output from previous day: 11/26 0701 - 11/27 0700 In: 1755 [I.V.:435; NG/GT:1320] Out: 796 [Urine:125; Stool:1; Chest Tube:670] Intake/Output this shift: Total I/O In: 880 [I.V.:220; NG/GT:660] Out: 475 [Urine:125; Chest Tube:350]  General appearance: sedated Resp: pale, on vent, opens eyes only Cardio: S1, S2 normal and systolic murmur: holosystolic 2/6, blowing at apex GI: pos bs, soft, liver down 4 cm Extremitie3+ edemaedema 3+ edema  Resp rales and rhonchi in bases  Lab Results:  Recent Labs  03/18/16 0430 03/19/16 0505  WBC 14.0* 10.2  HGB 8.9* 8.4*  HCT 27.0* 25.3*  PLT 228 255   BMET:  Recent Labs  03/18/16 1200 03/19/16 0505  NA 134* 137  K 5.6* 5.4*  CL 98* 100*  CO2 23 23  GLUCOSE 154* 134*  BUN 103* 116*  CREATININE 8.65* 9.21*  CALCIUM 7.9* 7.9*   No results for input(s): PTH in the last 72 hours. Iron Studies: No results for input(s): IRON, TIBC, TRANSFERRIN, FERRITIN in the last 72 hours.  Studies/Results: No results found.  I have reviewed the patient's current medications.  Assessment/Plan: 1 AKI oliguric ATN presumable sepsis.  ^ k, ^ solute, ^ vol .  Needs HD if to cont, need to do daily if cont 2 CVA 3 Pneu 4 Resp failure Vent 5 Nutrition P await plans, HD if cont, AB.      LOS: 10 days   Jamilee Lafosse L 03/19/2016,6:55 AM

## 2016-03-19 NOTE — Procedures (Signed)
Extubation Procedure Note  Patient Details:   Name: Duane Harper DOB: July 12, 1945 MRN: [id number]   Airway Documentation:     Evaluation  O2 sats: transiently fell during during procedure Complications: No apparent complications Patient did tolerate procedure well. Bilateral Breath Sounds: Rhonchi   No   Pt extubated per MD order as a 1-way extubation. Pt DNR/DNI and will transition to full comfort care. Pt unable to speak or cough post extubation. Pt placed on 2L [place] at this time. RT will continue to monitor.   Carolan ShiverKelley, Lizet Kelso M 03/19/2016, 3:05 PM

## 2016-03-20 ENCOUNTER — Encounter (HOSPITAL_COMMUNITY): Payer: Self-pay | Admitting: *Deleted

## 2016-03-20 DIAGNOSIS — Z66 Do not resuscitate: Secondary | ICD-10-CM

## 2016-03-20 DIAGNOSIS — Z515 Encounter for palliative care: Secondary | ICD-10-CM

## 2016-03-20 LAB — CULTURE, BLOOD (ROUTINE X 2)
Culture: NO GROWTH
Culture: NO GROWTH

## 2016-03-20 MED ORDER — ONDANSETRON HCL 4 MG/2ML IJ SOLN: 4.0000 mg | Freq: Four times a day (QID) | INTRAMUSCULAR | Status: DC | PRN

## 2016-03-20 MED ORDER — MORPHINE SULFATE (CONCENTRATE) 10 MG/0.5ML PO SOLN: 5.0000 mg | ORAL | Status: DC | PRN

## 2016-03-20 MED ORDER — GLYCOPYRROLATE 0.2 MG/ML IJ SOLN: 0.2000 mg | INTRAMUSCULAR | Status: DC | PRN

## 2016-03-20 MED ORDER — HALOPERIDOL LACTATE 5 MG/ML IJ SOLN: 0.5000 mg | INTRAMUSCULAR | Status: DC | PRN

## 2016-03-20 MED ORDER — HALOPERIDOL LACTATE 2 MG/ML PO CONC
0.5000 mg | ORAL | Status: DC | PRN
  Filled 2016-03-20: qty 0.3

## 2016-03-20 MED ORDER — LORAZEPAM 1 MG PO TABS: 1.0000 mg | ORAL_TABLET | ORAL | Status: DC | PRN

## 2016-03-20 MED ORDER — LORAZEPAM 2 MG/ML IJ SOLN: 1.0000 mg | INTRAMUSCULAR | Status: DC | PRN

## 2016-03-20 MED ORDER — MORPHINE SULFATE (PF) 2 MG/ML IV SOLN
1.0000 mg | INTRAVENOUS | Status: DC | PRN
  Administered 2016-03-21: 1 mg via INTRAVENOUS
  Filled 2016-03-20 (×2): qty 1

## 2016-03-20 MED ORDER — LORAZEPAM 2 MG/ML PO CONC: 1.0000 mg | ORAL | Status: DC | PRN

## 2016-03-20 MED ORDER — ONDANSETRON 4 MG PO TBDP
4.0000 mg | ORAL_TABLET | Freq: Four times a day (QID) | ORAL | Status: DC | PRN
  Filled 2016-03-20: qty 1

## 2016-03-20 MED ORDER — BISACODYL 10 MG RE SUPP: 10.0000 mg | Freq: Every day | RECTAL | Status: DC | PRN

## 2016-03-20 MED ORDER — ACETAMINOPHEN 650 MG RE SUPP
650.0000 mg | Freq: Four times a day (QID) | RECTAL | Status: DC | PRN
  Filled 2016-03-20: qty 1

## 2016-03-20 MED ORDER — ACETAMINOPHEN 325 MG PO TABS: 650.0000 mg | ORAL_TABLET | Freq: Four times a day (QID) | ORAL | Status: DC | PRN

## 2016-03-20 MED ORDER — POLYVINYL ALCOHOL 1.4 % OP SOLN
1.0000 [drp] | Freq: Four times a day (QID) | OPHTHALMIC | Status: DC | PRN
  Filled 2016-03-20: qty 15

## 2016-03-20 MED ORDER — BIOTENE DRY MOUTH MT LIQD: 15.0000 mL | OROMUCOSAL | Status: DC | PRN

## 2016-03-20 MED ORDER — GLYCOPYRROLATE 1 MG PO TABS
1.0000 mg | ORAL_TABLET | ORAL | Status: DC | PRN
  Filled 2016-03-20: qty 1

## 2016-03-20 MED ORDER — ACETAMINOPHEN 650 MG RE SUPP
650.0000 mg | RECTAL | Status: DC | PRN
Start: 2016-03-20 — End: 2016-03-20

## 2016-03-20 MED ORDER — HALOPERIDOL 1 MG PO TABS
0.5000 mg | ORAL_TABLET | ORAL | Status: DC | PRN
Start: 2016-03-20 — End: 2016-03-25

## 2016-03-20 NOTE — Progress Notes (Signed)
PULMONARY / CRITICAL CARE MEDICINE   Name: Duane Harper MRN: [id number] DOB: 1945-09-10    ADMISSION DATE:  2016/03/20  [facility] MD:  [facility]   CHIEF COMPLAINT:  Respiratory Failure   Brief:  History obtained from wife and records review as patient is sedated/intubated.   Duane Harper is 70 y.o. 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 Had left sided deficits from this CVA as well as cognitive changes. While undergoing rehab for his CVA, he developed dyspnea. Was diagnosed with pneumonia and bilateral pleural effusions. Patient was started on Levaquin and required supplemental O2. Patient's pleural fluid isolated Citrobacter. Fluid analysis suggested transudate process. Patient was treated with Lasix and antibiotics were broadened.  On 11/13, patient had a syncopal episode while participating in PT. He was found to be hypotensive at that time. Patient continued to remain febrile with worsening hypoxia despite supplemental O2 and abx coverage. CTA chest obtained which was negative for PE and slight progression of severe multilobar PNA compared to prior CT.   On 11/14, patient stated he desired intubation as his WOB was significant on BiPAP. Bronchoscopy was performed which showed no endobronchial lesions and minimal inflammation of the bronchi in the left upper lobe. Bronchial cultures showed normal oral respiratory flora.   Had discussion with wife regarding patient's wishes. She reports that he would not want to remain on ventilator for long term. He would not wish to have a tracheostomy or a PEG tube. His wish would be to be a DNR. He would not want chest compressions or defibrillation.   Imaging DG chest 11/20:  Fairly stable appearance of the chest. Small right apical pneumothorax. Persistent bilateral lateral upper lobe infiltrates with mild bibasilar atelectasis or infiltrate. The support tubes are in reasonable position.  11/21: Tube and  catheter positions are unchanged. Small apical pneumothorax on each side persist. No tension component. Airspace consolidation in both upper lobes and left base are stable. No new opacity. Stable cardiac silhouette. There is aortic atherosclerosis.   11/22: Persistent small pneumothoraces with bilateral upper lobe infiltrates stable from the prior exam.   11/24: Worsening opacity bilaterally likely congestion vs infiltration. Unchanged small bilateral pneumothoraces.  STUDIES:  CT chest 11/18>>>Extensive bilateral pulmonary opacities worrisome for an inflammatory process, ARDS, or edema are stable. Malignancy is not excluded.Moderate bilateral pleural effusions have increased.  CULTURES: 11/6 pleural fluid L with Citrobacter sensitive to Cefepime, Meropenem. Resistant to cefazolin 11/18 rt effusion>>>NGTD 11/18 left effusion>>>NGTD 11/17 BC>>>NGTD 11/17 sputum>>>Normal flora 11/22 Urine >> 11/22 sputum >Pending. GS with rare yeast 11/23 blood > NGTD   ANTIBIOTICS: Vancomycin 11/17 >> 11/20 Meropenem 11/17 >>   LINES/TUBES: ETT 11/14 >>  Right Femoral 11/17 >>11/18 Left IJ 11/18>>> Rt chest tuibe 11/18>>> Left chest tube 11/18>>> Foley 11/15>> HD cath in Rt IJ 11/22>>  SIGNIFICANT EVENTS: 11/1: Admitted to [facility] for hypoxia and dyspnea, found to have multilobar PNA and pleural effusions  11/6: Thoracentesis performed; culture grew Citrobacter  11/13: CTA chest negative for PE  11/4: Patient intubated  11/17: Patient started on Levophed, transferred to [facility]  11/18- Chest tubes placed, came off pressors, line neck placed, fem dc'ed 11/20 - AKI. Nephro consulted 11/21- Anuria with worsening renal function 11/22-Spiked mild fever. Pan cultured. HD started  11/23- changed from [facility] to PCV due to dyssynchrony with vent.  11/24- GOC with wife. Possible terminal wean on 03/19/2016 11/27-terminal extubation and full comfort care 11/28 proceed  with comfort  care.  SUBJECTIVE:  No events overnight, sedate, no new complaints.  VITAL SIGNS: BP (!) 148/63   Pulse (!) 110   Temp 99 F (37.2 C) (Axillary)   Resp (!) 24   Ht 5\' 9"  (1.753 m)   Wt 88.7 kg (195 lb 8.8 oz)   SpO2 (!) 79%   BMI 28.88 kg/m   HEMODYNAMICS:    VENTILATOR SETTINGS: FiO2 (%):  [40 %] 40 %  INTAKE / OUTPUT: I/O last 3 completed shifts: In: 1715 [I.V.:485; NG/GT:1180; IV Piggyback:50] Out: 1925 [Urine:725; Chest Tube:1200]  PHYSICAL EXAMINATION: General: chronically ill. Mild work of breathing Neuro:  Sleepy and sedated on fentanyl and ativan. HEENT:  Drumright in place.  [place]/AT, PERRL, EOM-I and MMM. Cardiovascular:  RRR, Nl S1/S2, -M/R/G. Lungs: Decrease BS diffusely  Musculoskeletal: 2+ edema in UE's, none in LE's Skin:  No rashes on exposed skin.   LABS:  BMET  Recent Labs Lab 03/18/16 0430 03/18/16 1200 03/19/16 0505  NA 134* 134* 137  K 5.8* 5.6* 5.4*  CL 96* 98* 100*  CO2 23 23 23   BUN 97* 103* 116*  CREATININE 8.18* 8.65* 9.21*  GLUCOSE 141* 154* 134*    Electrolytes  Recent Labs Lab 03/17/16 0500 03/18/16 0430 03/18/16 1200 03/19/16 0505  CALCIUM 7.9* 8.1* 7.9* 7.9*  MG 2.3 2.5*  --  2.6*  PHOS 8.4* 9.5*  --  11.6*    CBC  Recent Labs Lab 03/17/16 0500 03/18/16 0430 03/19/16 0505  WBC 9.4 14.0* 10.2  HGB 8.5* 8.9* 8.4*  HCT 26.0* 27.0* 25.3*  PLT 181 228 255    Coag's No results for input(s): APTT, INR in the last 168 hours.  Sepsis Markers  Recent Labs Lab 03/15/16 1126 03/16/16 0420 03/17/16 0500  PROCALCITON 1.54 1.46 1.48    ABG  Recent Labs Lab 03/14/16 0702 03/15/16 1654  PHART 7.349* 7.393  PCO2ART 38.7 37.4  PO2ART 113* 103    Liver Enzymes  Recent Labs Lab 03/14/16 1702 03/14/16 1728  ALT  --  28  ALBUMIN 1.8*  --     Cardiac Enzymes No results for input(s): TROPONINI, PROBNP in the last 168 hours.  Glucose  Recent Labs Lab 03/18/16 2345 03/19/16 0437 03/19/16 0730  03/19/16 1216 03/19/16 1717 03/19/16 1936  GLUCAP 127* 127* 125* 131* 113* 114*    ASSESSMENT / PLAN:  PULMONARY A: Acute Hypoxemic Hypercapneic resp fx 2/2 Empyema, B, with Citrobacter in pleural fluid (11/6) with Multilobar PNA S/P Chest tube R and L, 11/18, with small apical PTX, bilateral.  H/o tobacco abuse, likely COPD  Terminal extubation and transition to comfort care 11/27 after discussion with family P:   Continue comfort care Morphine drip for comfort Keep chest tube in for comfort.  CARDIOVASCULAR A:  H/o HTN  Echo 11/20 with EF of 60-65%, mod LVH and PAPP 51 History of UE DVT. LE doppler 11/20 without DVT P:  Continue comfort care Discontinue monitors  RENAL A:   ARF on CKD 2/2 sepsis/hypovolemia Oliguria.  P:   No further labs or dialysis Continue comfort care D/C blood draws.  GASTROINTESTINAL A:   KUB > no obsturction P:   NPO D/C Miralax/Dulcolax  HEMATOLOGIC A:   Acute anemia, s/p 1u PRBC with appropriate response (admission)  Hgb stable P:  No more CBC or labs D/C Heparin  INFECTIOUS A:   Multilobar PNA / Empyema 2/2 to Citrobacter.  Status full course of appropriate antibiotics  P:   Comfort  care  ENDOCRINE A:   T2DM, controlled P:   Stop CBG monitoring  NEUROLOGIC A:   H/o MS  Recent CVA (10/17)  P:   Appreciate Palliative Care recs.  Fentanyl, Rubinol and ativan for comfort, continue for now.  FAMILY  - Updates: Extensive discussion with the family (wife and daughter) along with palliative care team, after discussion, the wife was considering changing back to full code for further interventions, but after discussion decision was made to continue with DNR and proceed with comfort care and transfer to palliative care floor.  The patient is critically ill with multiple organ systems failure and requires high complexity decision making for assessment and support, frequent evaluation and titration of therapies, application  of advanced monitoring technologies and extensive interpretation of multiple databases.   Critical Care Time devoted to patient care services described in this note is  35  Minutes. This time reflects time of care of this signee Dr Koren Bound. This critical care time does not reflect procedure time, or teaching time or supervisory time of PA/NP/Med student/Med Resident etc but could involve care discussion time.  Alyson Reedy, M.D. [facility] Pulmonary/Critical Care Medicine. Pager: [phone]. After hours pager: [phone].

## 2016-03-20 NOTE — Progress Notes (Signed)
Subjective: Interval History: extub but not at comfort care. No labs.  .  Objective: Vital signs in last 24 hours: Temp:  [98.7 F (37.1 C)-99.8 F (37.7 C)] 99.8 F (37.7 C) (11/27 1942) Pulse Rate:  [85-127] 116 (11/28 0400) Resp:  [14-26] 18 (11/28 0400) BP: (130-164)/(59-89) 164/66 (11/28 0400) SpO2:  [79 %-99 %] 81 % (11/28 0400) FiO2 (%):  [40 %] 40 % (11/27 1331) Weight change:   Intake/Output from previous day: 11/27 0701 - 11/28 0700 In: 835 [I.V.:265; NG/GT:520; IV Piggyback:50] Out: 1450 [Urine:600; Chest Tube:850] Intake/Output this shift: No intake/output data recorded.  General appearance: pale and sedated and not conversant or responsive Neck: RIJ cath Resp: rales bibasilar and rhonchi bibasilar Cardio: S1, S2 normal and systolic murmur: systolic ejection 2/6, decrescendo at 2nd left intercostal space GI: pos bs, soft Extremities: edema 3+  Lab Results:  Recent Labs  03/18/16 0430 03/19/16 0505  WBC 14.0* 10.2  HGB 8.9* 8.4*  HCT 27.0* 25.3*  PLT 228 255   BMET:  Recent Labs  03/18/16 1200 03/19/16 0505  NA 134* 137  K 5.6* 5.4*  CL 98* 100*  CO2 23 23  GLUCOSE 154* 134*  BUN 103* 116*  CREATININE 8.65* 9.21*  CALCIUM 7.9* 7.9*   No results for input(s): PTH in the last 72 hours. Iron Studies: No results for input(s): IRON, TIBC, TRANSFERRIN, FERRITIN in the last 72 hours.  Studies/Results: No results found.  I have reviewed the patient's current medications.  Assessment/Plan: 1 AKI nonoliguric.  Not clear what course is to be . No labs, and may have some recovery or may need HD.  Needs a decision committed to at this time 2 Pneu 3 CVA 4 Anemia P Please let me know if not comfort care.  Will make plans accordingly    LOS: 11 days   Leshea Jaggers L 03/20/2016,7:09 AM

## 2016-03-20 NOTE — Progress Notes (Signed)
Daily Progress Note   Patient Name: Duane Harper       Date: 03/20/2016 DOB: [birth year]  Age: 70 y.o. MRN#: [id number] Attending Physician: Duane Greathouse, MD Primary Care Physician: Duane Fusi, MD Admit Date: 03/05/2016  Reason for Consultation/Follow-up: Establishing goals of care, Psychosocial/spiritual support, Terminal Care and Withdrawal of life-sustaining treatment  Subjective:  -placed call to wife regarding current medical situation.  Patient is unresponsive and I believe is transitioning to end of life.   -continued conversation with wife regarding prognosis ( likely hours to days) and GOC.  We discussed that HD at this point in time is not recommended especially within the context of the patient's stated wishes;   no trach no PEG and no hemodialysis.  -Mrs Duane Harper an understanding of a supportive approach to care ;  focusing on comfort, and dignity. Prognosis is likely hours to days.   Length of Stay: 11  Current Medications: Scheduled Meds:  . glycopyrrolate  0.4 mg Intravenous TID    Continuous Infusions: . fentaNYL infusion INTRAVENOUS Stopped (03/19/16 1500)    PRN Meds: sodium chloride, acetaminophen, fentaNYL (SUBLIMAZE) injection, LORazepam, midazolam  Physical Exam  Constitutional: He appears well-developed. He appears ill. Nasal cannula in place.  HENT:  Mouth/Throat: Mucous membranes are dry.  Cardiovascular: Tachycardia present.   Pulmonary/Chest: Tachypnea noted.  Genitourinary:  Genitourinary Comments: Foley; rectal tube  Neurological: He is unresponsive.  Skin: Skin is warm. He is diaphoretic.  Nursing note and vitals reviewed.           Vital Signs: BP (!) 180/72   Pulse (!) 107   Temp 99.8 F (37.7 C) (Oral)   Resp (!)  21   Ht 5\' 9"  (1.753 m)   Wt 88.7 kg (195 lb 8.8 oz)   SpO2 (!) 80%   BMI 28.88 kg/m  SpO2: SpO2: (!) 80 % O2 Device: O2 Device: Nasal Cannula O2 Flow Rate: O2 Flow Rate (L/min): 0.5 L/min  Intake/output summary:   Intake/Output Summary (Last 24 hours) at 03/20/16 0902 Last data filed at 03/20/16 0800  Gross per 24 hour  Intake              550 ml  Output             1450 ml  Net             -  900 ml   LBM: Last BM Date: 03/18/16 Baseline Weight: Weight: 83.3 kg (183 lb 10.3 oz) Most recent weight: Weight: 88.7 kg (195 lb 8.8 oz)       Palliative Assessment/Data:  10%     Flowsheet Rows   Flowsheet Row Most Recent Value  Intake Tab  Referral Department  Hospitalist  Unit at Time of Referral  ICU  Palliative Care Primary Diagnosis  Sepsis/Infectious Disease  Date Notified  03/16/16  Palliative Care Type  New Palliative care  Reason for referral  Clarify Goals of Care, Psychosocial or Spiritual support  Date of Admission  03/02/16  Date first seen by Palliative Care  03/16/16  # of days Palliative referral response time  0 Day(s)  # of days IP prior to Palliative referral  14  Clinical Assessment  Palliative Performance Scale Score  20%  Pain Max last 24 hours  Not able to report  Pain Min Last 24 hours  Not able to report  Dyspnea Max Last 24 Hours  Not able to report  Dyspnea Min Last 24 hours  Not able to report  Nausea Max Last 24 Hours  Not able to report  Nausea Min Last 24 Hours  Not able to report  Anxiety Max Last 24 Hours  Not able to report  Anxiety Min Last 24 Hours  Not able to report  Other Max Last 24 Hours  Not able to report  Psychosocial & Spiritual Assessment  Palliative Care Outcomes  Patient/Family meeting held?  Yes  Who was at the meeting?  wife  Palliative Care Outcomes  Clarified goals of care  Patient/Family wishes: Interventions discontinued/not started   PEG, Trach  Palliative Care follow-up planned  Yes, Facility      Patient  Active Problem List   Diagnosis Date Noted  . DNR (do not resuscitate)   . Pressure injury of skin 03/16/2016  . Palliative care encounter   . AKI (acute kidney injury) (HCC)   . Empyema (HCC)   . Encounter for chest tube placement   . Encounter for central line care   . Acute respiratory failure with hypoxia (HCC) 03/10/2016    Palliative Care Assessment & Plan   Patient Profile: 70 y.o. 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   He was diagnosed with pneumonia and bilateral pleural effusions. On 03/05/2016 patient had a syncopal episode while participating in physical therapy .  He was found to be hypotensive at the time, became febrile despite antibiotic coverage.   CTA of the chest revealed no PE but progression of multi lobar pneumonia compared to prior CT. On 11/14, patient required intubation secondary to his increased work of breathing. A . On 03/10/2016 chest tubes were placed, he came off pressors.   Patient was liberated from the vent yesterday and although he continues to breath on his own he is unresponsive and declining.  Prognosis is hours to days, wife supports a full comfort path.    Recommendations/Plan:  - wife understands the limited prognosis, and supports decision for no further dialysis  -focus of care is comfort -once family arrives for visit this morning will discuss transition to regular floor    Goals of Care and Additional Recommendations:  Limitations on Scope of Treatment: No Artificial Feeding, No Chemotherapy,  No Hemodialysis, No Surgical Procedures and No Tracheostomy  Code Status:    Code Status Orders        Start     Ordered   03/19/2016 2136  Do not attempt resuscitation (DNR)  Continuous     Question Answer Comment  In the event of cardiac or respiratory ARREST Do not call a "code blue"   In the event of cardiac or respiratory ARREST Do not perform Intubation, CPR, defibrillation or ACLS   In the event of cardiac or respiratory ARREST Use medication by any route, position, wound care, and other measures to relive pain and suffering. May use oxygen, suction and manual treatment of airway obstruction as needed for comfort.      03/12/2016 2139    Code Status History    Date Active Date Inactive Code Status Order ID Comments User Context   This patient has a current code status but no historical code status.       Prognosis:   Hours - Days after one way extubation.   Discharge Planning:  Anticipated Hospital Death  Thank you for allowing the Palliative Medicine Team to assist in the care of this patient.   Time In: 0825 Time Out: 0900 Total Time  35 min Prolonged Time Billed  no       Greater than 50%  of this time was spent counseling and coordinating care related to the above assessment and plan.  Lorinda CreedLARACH, Saron Vanorman, NP  Please contact Palliative Medicine Team phone at [phone] for questions and concerns.

## 2016-03-20 NOTE — Progress Notes (Signed)
PULMONARY / CRITICAL CARE MEDICINE   Name: Duane Harper MRN: [id number] DOB: 07-Dec-1945    ADMISSION DATE:  2016/03/20  REFERRING MD:  [facility]   CHIEF COMPLAINT:  Respiratory Failure   Brief:  History obtained from wife and records review as patient is sedated/intubated.   Duane Harper is 70 y.o. 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 Had left sided deficits from this CVA as well as cognitive changes. While undergoing rehab for his CVA, he developed dyspnea. Was diagnosed with pneumonia and bilateral pleural effusions. Patient was started on Levaquin and required supplemental O2. Patient's pleural fluid isolated Citrobacter. Fluid analysis suggested transudate process. Patient was treated with Lasix and antibiotics were broadened.  On 11/13, patient had a syncopal episode while participating in PT. He was found to be hypotensive at that time. Patient continued to remain febrile with worsening hypoxia despite supplemental O2 and abx coverage. CTA chest obtained which was negative for PE and slight progression of severe multilobar PNA compared to prior CT.   On 11/14, patient stated he desired intubation as his WOB was significant on BiPAP. Bronchoscopy was performed which showed no endobronchial lesions and minimal inflammation of the bronchi in the left upper lobe. Bronchial cultures showed normal oral respiratory flora.   Had discussion with wife regarding patient's wishes. She reports that he would not want to remain on ventilator for long term. He would not wish to have a tracheostomy or a PEG tube. His wish would be to be a DNR. He would not want chest compressions or defibrillation.   Imaging DG chest 11/20:  Fairly stable appearance of the chest. Small right apical pneumothorax. Persistent bilateral lateral upper lobe infiltrates with mild bibasilar atelectasis or infiltrate. The support tubes are in reasonable position.  11/21: Tube and  catheter positions are unchanged. Small apical pneumothorax on each side persist. No tension component. Airspace consolidation in both upper lobes and left base are stable. No new opacity. Stable cardiac silhouette. There is aortic atherosclerosis.   11/22: Persistent small pneumothoraces with bilateral upper lobe infiltrates stable from the prior exam.   11/24: Worsening opacity bilaterally likely congestion vs infiltration. Unchanged small bilateral pneumothoraces.  STUDIES:  CT chest 11/18>>>Extensive bilateral pulmonary opacities worrisome for an inflammatory process, ARDS, or edema are stable. Malignancy is not excluded.Moderate bilateral pleural effusions have increased.  CULTURES: 11/6 pleural fluid L with Citrobacter sensitive to Cefepime, Meropenem. Resistant to cefazolin 11/18 rt effusion>>>NGTD 11/18 left effusion>>>NGTD 11/17 BC>>>NGTD 11/17 sputum>>>Normal flora 11/22 Urine >> 11/22 sputum >Pending. GS with rare yeast 11/23 blood > NGTD   ANTIBIOTICS: Vancomycin 11/17 >> 11/20 Meropenem 11/17 >>   LINES/TUBES: ETT 11/14 >>  Right Femoral 11/17 >>11/18 Left IJ 11/18>>> Rt chest tuibe 11/18>>> Left chest tube 11/18>>> Foley 11/15>> HD cath in Rt IJ 11/22>>  SIGNIFICANT EVENTS: 11/1: Admitted to [facility] for hypoxia and dyspnea, found to have multilobar PNA and pleural effusions  11/6: Thoracentesis performed; culture grew Citrobacter  11/13: CTA chest negative for PE  11/4: Patient intubated  11/17: Patient started on Levophed, transferred to [facility]  11/18- Chest tubes placed, came off pressors, line neck placed, fem dc'ed 11/20 - AKI. Nephro consulted 11/21- Anuria with worsening renal function 11/22-Spiked mild fever. Pan cultured. HD started  11/23- changed from [facility] to PCV due to dyssynchrony with vent.  11/24- GOC with wife. Possible terminal wean on 03/19/2016 11/27-terminal extubation and full comfort care  SUBJECTIVE:  Sleepy and  sedated. Some upper airsound and mild work of breathing on [place].   VITAL SIGNS: BP (!) 180/72   Pulse (!) 110   Temp 99 F (37.2 C) (Axillary)   Resp 20   Ht 5\' 9"  (1.753 m)   Wt 88.7 kg (195 lb 8.8 oz)   SpO2 (!) 79%   BMI 28.88 kg/m   HEMODYNAMICS:    VENTILATOR SETTINGS: Vent Mode: PCV FiO2 (%):  [40 %] 40 % Set Rate:  [20 bmp] 20 bmp Pressure Support:  [8 cmH20] 8 cmH20 Plateau Pressure:  [27 cmH20] 27 cmH20  INTAKE / OUTPUT: I/O last 3 completed shifts: In: 1715 [I.V.:485; NG/GT:1180; IV Piggyback:50] Out: 1925 [Urine:725; Chest Tube:1200]  PHYSICAL EXAMINATION: General: chronically ill. Mild work of breathing Neuro:  Sleepy and sedated HEENT:  [place] in place Cardiovascular:  RRR on monitor Lungs: mild work of breathing on [place] Musculoskeletal: 2+ edema in UE's, none in LE's Skin:  No rashes on exposed skin.   LABS:  BMET  Recent Labs Lab 03/18/16 0430 03/18/16 1200 03/19/16 0505  NA 134* 134* 137  K 5.8* 5.6* 5.4*  CL 96* 98* 100*  CO2 23 23 23   BUN 97* 103* 116*  CREATININE 8.18* 8.65* 9.21*  GLUCOSE 141* 154* 134*    Electrolytes  Recent Labs Lab 03/17/16 0500 03/18/16 0430 03/18/16 1200 03/19/16 0505  CALCIUM 7.9* 8.1* 7.9* 7.9*  MG 2.3 2.5*  --  2.6*  PHOS 8.4* 9.5*  --  11.6*    CBC  Recent Labs Lab 03/17/16 0500 03/18/16 0430 03/19/16 0505  WBC 9.4 14.0* 10.2  HGB 8.5* 8.9* 8.4*  HCT 26.0* 27.0* 25.3*  PLT 181 228 255    Coag's No results for input(s): APTT, INR in the last 168 hours.  Sepsis Markers  Recent Labs Lab 03/15/16 1126 03/16/16 0420 03/17/16 0500  PROCALCITON 1.54 1.46 1.48    ABG  Recent Labs Lab 03/14/16 0702 03/15/16 1654  PHART 7.349* 7.393  PCO2ART 38.7 37.4  PO2ART 113* 103    Liver Enzymes  Recent Labs Lab 03/14/16 1702 03/14/16 1728  ALT  --  28  ALBUMIN 1.8*  --     Cardiac Enzymes No results for input(s): TROPONINI, PROBNP in the last 168 hours.  Glucose  Recent  Labs Lab 03/18/16 2345 03/19/16 0437 03/19/16 0730 03/19/16 1216 03/19/16 1717 03/19/16 1936  GLUCAP 127* 127* 125* 131* 113* 114*    ASSESSMENT / PLAN:  PULMONARY A: Acute Hypoxemic Hypercapneic resp fx 2/2 Empyema, B, with Citrobacter in pleural fluid (11/6) with Multilobar PNA S/P Chest tube R and L, 11/18, with small apical PTX, bilateral.  H/o tobacco abuse, likely COPD  Terminal extubation and transition to comfort care 11/27 after discussion with family P:   Continue comfort care Consider starting morphine drip once wife arrives Discontinue chest tube  CARDIOVASCULAR A:  H/o HTN  Echo 11/20 with EF of 60-65%, mod LVH and PAPP 51 History of UE DVT. LE doppler 11/20 without DVT P:  Continue comfort care Discontinue monitors once wife gets here  RENAL A:   ARF on CKD 2/2 sepsis/hypovolemia Oliguria.  P:   No further labs or dialysis Continue comfort care  GASTROINTESTINAL A:   KUB > no obsturction P:   NPO Continue Miralax/Dulcolax  HEMATOLOGIC A:   Acute anemia, s/p 1u PRBC with appropriate response (admission)  Hgb stable P:  No more CBC or labs Discontinue Heparin  INFECTIOUS A:   Multilobar PNA /  Empyema 2/2 to Citrobacter.  Status full course of appropriate antibiotics  P:   Comfort care Consider taking the chest tube out  ENDOCRINE A:   T2DM, controlled P:   Stop CBG monitoring  NEUROLOGIC A:   H/o MS  Recent CVA (10/17)  P:   Appreciate Palliative Care recs.  Fentanyl, Rubinol and ativan for comfort  FAMILY  - Updates: no family at bedside - Transfer to [facility] once wife gets here   Candelaria Stagers, MD.  03/20/16 9:26 AM PGY-2  Alyson Reedy, M.D. [facility] Pulmonary/Critical Care Medicine. Pager: [phone]. After hours pager: [phone].

## 2016-03-20 NOTE — Consult Note (Signed)
   [facility] Inpatient Consult   03/20/2016  SAHIBJOT SIEMENS 1945-06-05 [id number]  Patient screened for potential [facility] Care Management services. Patient is eligible for [facility] Care Management services under patient's [employer] Medicare plan/ACO Registry.  Chart review reveals patient is unresponsive, intubated/sedated and Palliative Care for endo of life issues is following. No community follow up from [facility] Care Management needed at this time.  For questions contact:   Charlesetta Shanks, RN BSN CCM [facility]  [phone] business mobile phone Toll free office [phone]

## 2016-03-20 NOTE — Progress Notes (Signed)
Patient transferred to 6n22 via bed tolerated well . Patient's wife called and informed of transfer and new room number.

## 2016-03-21 DIAGNOSIS — R0609 Other forms of dyspnea: Secondary | ICD-10-CM

## 2016-03-21 MED ORDER — SODIUM CHLORIDE 0.9 % IV SOLN
50.0000 ug/h | INTRAVENOUS | Status: DC
  Filled 2016-03-21: qty 50

## 2016-03-21 MED ORDER — LORAZEPAM 2 MG/ML IJ SOLN
1.0000 mg | Freq: Four times a day (QID) | INTRAMUSCULAR | Status: DC
  Administered 2016-03-21 – 2016-03-24 (×14): 1 mg via INTRAVENOUS
  Filled 2016-03-21 (×14): qty 1

## 2016-03-21 MED ORDER — FENTANYL 2500MCG IN NS 250ML (10MCG/ML) PREMIX INFUSION
75.0000 ug/h | INTRAVENOUS | Status: DC
  Administered 2016-03-21 – 2016-03-23 (×2): 50 ug/h via INTRAVENOUS
  Administered 2016-03-24: 75 ug/h via INTRAVENOUS
  Filled 2016-03-21 (×4): qty 250

## 2016-03-21 NOTE — Progress Notes (Signed)
Nutrition Brief Note  Chart reviewed. Pt now transitioning to comfort care.  No further nutrition interventions warranted at this time.  Please re-consult as needed.   Emmalee Solivan A. Sonali Wivell, RD, LDN, CDE Pager: [phone] After hours Pager: [phone]  

## 2016-03-21 NOTE — Progress Notes (Signed)
Patient ID: Duane Harper, male   DOB: 05/24/1945, 70 y.o.   MRN: [id number] Will not follow formally at this time but be available for questions and supportive care.

## 2016-03-21 NOTE — Progress Notes (Signed)
PROGRESS NOTE  Duane Harper ZOX:[id number] DOB: 1945-04-30 DOA: 02/27/2016 PCP: Paulina Fusi, MD   LOS: 12 days   Brief Narrative: 70 y.o. 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 Had left sided deficits from this CVA as well as cognitive changes. While undergoing rehab for his CVA, he developed dyspnea. Was diagnosed with pneumonia and bilateral pleural effusions. Patient was started on Levaquin and required supplemental O2. Patient's pleural fluid isolated Citrobacter. Fluid analysis suggested transudate process. Patient was treated with Lasix and antibiotics were broadened. On 11/13, patient had a syncopal episode while participating in PT. He was found to be hypotensive at that time. Patient continued to remain febrile with worsening hypoxia despite supplemental O2 and abx coverage. CTA chest obtained which was negative for PE and slight progression of severe multilobar PNA compared to prior CT. On 11/14, patient stated he desired intubation as his WOB was significant on BiPAP. Bronchoscopy was performed which showed no endobronchial lesions and minimal inflammation of the bronchi in the left upper lobe. Bronchial cultures showed normal oral respiratory flora. PCCM had discussion with wife regarding patient's wishes. She reports that he would not want to remain on ventilator for long term. He would not wish to have a tracheostomy or a PEG tube. His wish would be to be a DNR. He would not want chest compressions or defibrillation. He was transferred to floor on full comfort and TRH took over 11/29. Palliative following as well.   SIGNIFICANT EVENTS: 11/1: Admitted to [facility] for hypoxia and dyspnea, found to have multilobar PNA and pleural effusions  11/6: Thoracentesis performed; culture grew Citrobacter  11/13: CTA chest negative for PE  11/4: Patient intubated  11/17: Patient started on Levophed, transferred to [facility]  11/18- Chest tubes  placed, came off pressors, line neck placed, fem dc'ed 11/20 - AKI. Nephro consulted 11/21- Anuria with worsening renal function 11/22-Spiked mild fever. Pan cultured. HD started  11/23- changed from [facility] to [facility] due to dyssynchrony with vent.  11/24- GOC with wife. Possible terminal wean on 03/19/2016 11/27-terminal extubation and full comfort care 11/28 proceed with comfort care.  Assessment & Plan: Active Problems:   Acute respiratory failure with hypoxia (HCC)   Encounter for central line care   Encounter for chest tube placement   Empyema (HCC)   AKI (acute kidney injury) (HCC)   Pressure injury of skin   Palliative care encounter   DNR (do not resuscitate)  Goals of care - Multiple discussions were held between the critical care MDs, palliative care and the family. Decision was made, per patient's wishes to stop aggressive interventions and focus on comfort. Anticipate in-hospital death. Acute Hypoxemic Hypercapneic respiratory failure  - due to empyema, B, with Citrobacter in pleural fluid (11/6) with Multilobar PNA - comfort care S/P Chest tube R and L, 11/18, with small apical PTX, bilateral - keep chest tube in for comfort H/o tobacco abuse, likely COPD  Terminal extubation and transition to comfort care 11/27 after discussion with family HTN  - Echo 11/20 with EF of 60-65%, mod LVH and PAPP 51 History of UE DVT  - LE doppler 11/20 without DVT AKI on CKD 2/2 sepsis/hypovolemia - with oliguria, Cr continues to rise, no further labs Acute anemia, s/p 1u PRBC with appropriate response (admission)  - Hgb stable since, now comfort Multilobar PNA / Empyema 2/2 to Citrobacter.  - Status full course of appropriate antibiotics  T2DM, controlled H/o MS  Recent CVA (10/17)   DVT prophylaxis: none Code Status: DNR Family Communication: no family bedside Disposition Plan: anticipate in-hospital death  Consultants:   PCCM  Palliative  Procedures:   ETT 11/14 >>    Right Femoral 11/17 >>11/18 Left IJ 11/18>>> Rt chest tuibe 11/18>>> Left chest tube 11/18>>> Foley 11/15>> HD cath in Rt IJ 11/22  Antimicrobials: Vancomycin 11/17 >> 11/20 Meropenem 11/17 >> 11/27  Subjective: - unresponsive, agonal breathing   Objective: Vitals:   03/20/16 1600 03/20/16 1700 03/20/16 1721 03/21/16 0009  BP: (!) 156/69  (!) 158/71 (!) 161/85  Pulse: (!) 111 (!) 109 (!) 106 (!) 116  Resp: (!) 22 (!) 23 (!) 22 (!) 22  Temp:   98.6 F (37 C) 98.6 F (37 C)  TempSrc:   Axillary Oral  SpO2: (!) 81% (!) 80% (!) 82% (!) 69%  Weight:      Height:        Intake/Output Summary (Last 24 hours) at 03/21/16 1330 Last data filed at 03/21/16 [id number]  Gross per 24 hour  Intake               30 ml  Output             2650 ml  Net            -2620 ml   Filed Weights   03/17/16 0405 03/18/16 0100 03/19/16 0500  Weight: 89.2 kg (196 lb 10.4 oz) 88.7 kg (195 lb 8.8 oz) 88.7 kg (195 lb 8.8 oz)    Examination: Constitutional: unresponsive Vitals:   03/20/16 1600 03/20/16 1700 03/20/16 1721 03/21/16 0009  BP: (!) 156/69  (!) 158/71 (!) 161/85  Pulse: (!) 111 (!) 109 (!) 106 (!) 116  Resp: (!) 22 (!) 23 (!) 22 (!) 22  Temp:   98.6 F (37 C) 98.6 F (37 C)  TempSrc:   Axillary Oral  SpO2: (!) 81% (!) 80% (!) 82% (!) 69%  Weight:      Height:       ENMT: Mucous membranes are dry. Pale appearing Respiratory: coarse breath sounds Cardiovascular: tachycardic   Data Reviewed: I have personally reviewed following labs and imaging studies  CBC:  Recent Labs Lab 03/15/16 0315 03/16/16 0420 03/17/16 0500 03/18/16 0430 03/19/16 0505  WBC 7.6 8.0 9.4 14.0* 10.2  HGB 7.9* 7.3* 8.5* 8.9* 8.4*  HCT 23.8* 22.4* 26.0* 27.0* 25.3*  MCV 82.9 83.3 83.3 82.6 82.7  PLT 193 176 181 228 255   Basic Metabolic Panel:  Recent Labs Lab 03/15/16 0315 03/16/16 0420 03/17/16 0500 03/18/16 0430 03/18/16 1200 03/19/16 0505  NA 138 137 136 134* 134* 137  K 4.9 5.1 4.9  5.8* 5.6* 5.4*  CL 103 101 99* 96* 98* 100*  CO2 24 24 26 23 23 23   GLUCOSE 140* 141* 125* 141* 154* 134*  BUN 77* 100* 74* 97* 103* 116*  CREATININE 8.51* 9.68* 7.54* 8.18* 8.65* 9.21*  CALCIUM 8.2* 7.8* 7.9* 8.1* 7.9* 7.9*  MG 2.3 2.6* 2.3 2.5*  --  2.6*  PHOS 8.5* 9.9* 8.4* 9.5*  --  11.6*   GFR: Estimated Creatinine Clearance: 8.3 mL/min (by C-G formula based on SCr of 9.21 mg/dL (H)). Liver Function Tests:  Recent Labs Lab 03/14/16 1702 03/14/16 1728  ALT  --  28  ALBUMIN 1.8*  --    No results for input(s): LIPASE, AMYLASE in the last 168 hours. No results for input(s): AMMONIA in the last 168 hours. Coagulation Profile:  No results for input(s): INR, PROTIME in the last 168 hours. Cardiac Enzymes: No results for input(s): CKTOTAL, CKMB, CKMBINDEX, TROPONINI in the last 168 hours. BNP (last 3 results) No results for input(s): PROBNP in the last 8760 hours. HbA1C: No results for input(s): HGBA1C in the last 72 hours. CBG:  Recent Labs Lab 03/19/16 0437 03/19/16 0730 03/19/16 1216 03/19/16 1717 03/19/16 1936  GLUCAP 127* 125* 131* 113* 114*   Lipid Profile:  Recent Labs  03/19/16 0505  TRIG 140   Thyroid Function Tests: No results for input(s): TSH, T4TOTAL, FREET4, T3FREE, THYROIDAB in the last 72 hours. Anemia Panel: No results for input(s): VITAMINB12, FOLATE, FERRITIN, TIBC, IRON, RETICCTPCT in the last 72 hours. Urine analysis:    Component Value Date/Time   COLORURINE AMBER (A) March 29, 2016 1814   APPEARANCEUR TURBID (A) 03/29/16 1814   LABSPEC 1.028 March 29, 2016 1814   PHURINE 5.0 03-29-16 1814   GLUCOSEU NEGATIVE Mar 29, 2016 1814   HGBUR LARGE (A) March 29, 2016 1814   BILIRUBINUR SMALL (A) 03-29-2016 1814   KETONESUR 15 (A) March 29, 2016 1814   PROTEINUR 100 (A) 03-29-2016 1814   NITRITE NEGATIVE 03-29-2016 1814   LEUKOCYTESUR SMALL (A) 03-29-2016 1814   Sepsis Labs: Invalid input(s): PROCALCITONIN, LACTICIDVEN  Recent Results (from the past  240 hour(s))  Culture, Urine     Status: None   Collection Time: 03/14/16 11:43 PM  Result Value Ref Range Status   Specimen Description URINE, CATHETERIZED  Final   Special Requests NONE  Final   Culture NO GROWTH  Final   Report Status 03/16/2016 FINAL  Final  Culture, respiratory (NON-Expectorated)     Status: None   Collection Time: 03/14/16 11:43 PM  Result Value Ref Range Status   Specimen Description TRACHEAL ASPIRATE  Final   Special Requests NONE  Final   Gram Stain   Final    RARE SQUAMOUS EPITHELIAL CELLS PRESENT RARE WBC PRESENT,BOTH PMN AND MONONUCLEAR RARE BUDDING YEAST SEEN    Culture Consistent with normal respiratory flora.  Final   Report Status 03/17/2016 FINAL  Final  Culture, blood (Routine X 2) w Reflex to ID Panel     Status: None   Collection Time: 03/15/16 12:37 AM  Result Value Ref Range Status   Specimen Description BLOOD LEFT HAND  Final   Special Requests AEROBIC BOTTLE ONLY  Final   Culture NO GROWTH 5 DAYS  Final   Report Status 03/20/2016 FINAL  Final  Culture, blood (Routine X 2) w Reflex to ID Panel     Status: None   Collection Time: 03/15/16 12:43 AM  Result Value Ref Range Status   Specimen Description BLOOD RIGHT HAND  Final   Special Requests AEROBIC BOTTLE ONLY  Final   Culture NO GROWTH 5 DAYS  Final   Report Status 03/20/2016 FINAL  Final      Radiology Studies: No results found.   Scheduled Meds: . glycopyrrolate  0.4 mg Intravenous TID  . LORazepam  1 mg Intravenous Q6H   Continuous Infusions: . fentaNYL 50 mcg/hr (03/21/16 1035)    Pamella Pert, MD, PhD [facility] Pager [phone]  If 7PM-7AM, please contact night-coverage [email or web address] Password [email or web address] 03/21/2016, 1:30 PM

## 2016-03-21 NOTE — Progress Notes (Signed)
Daily Progress Note   Patient Name: Duane Harper       Date: 03/21/2016 DOB: [birth year]  Age: 70 y.o. MRN#: [id number] Attending Physician: Leatha Gilding, MD Primary Care Physician: Paulina Fusi, MD Admit Date: 03/19/2016  Reason for Consultation/Follow-up: Establishing goals of care, Psychosocial/spiritual support, Terminal Care and Withdrawal of life-sustaining treatment  Subjective:  -placed call to wife regarding current medical situation.  Patient is unresponsive and I believe is transitioning to end of life.   -continued conversation with wife regarding prognosis ( likely hours to days) and GOC.  We discussed that HD at this point in time is not recommended especially within the context of the patient's stated wishes;   no trach no PEG and no hemodialysis.  -Mrs Duane Harper an understanding of a supportive approach to care ;  focusing on comfort, and dignity. Prognosis is likely hours to days.   Length of Stay: 12  Current Medications: Scheduled Meds:  . glycopyrrolate  0.4 mg Intravenous TID  . LORazepam  1 mg Intravenous Q6H    Continuous Infusions: . fentaNYL infusion INTRAVENOUS    . fentaNYL infusion INTRAVENOUS Stopped (03/19/16 1500)    PRN Meds: acetaminophen **OR** acetaminophen, antiseptic oral rinse, bisacodyl, fentaNYL (SUBLIMAZE) injection, glycopyrrolate **OR** glycopyrrolate **OR** glycopyrrolate, haloperidol **OR** haloperidol **OR** haloperidol lactate, LORazepam **OR** LORazepam **OR** LORazepam, morphine injection, morphine CONCENTRATE **OR** morphine CONCENTRATE, ondansetron **OR** ondansetron (ZOFRAN) IV, polyvinyl alcohol  Physical Exam  Constitutional: He appears well-developed. He appears ill. Nasal cannula in place.  HENT:    Mouth/Throat: Mucous membranes are dry.  Cardiovascular: Tachycardia present.   Pulmonary/Chest: Tachypnea noted.  Genitourinary:  Genitourinary Comments: Foley; rectal tube  Neurological: He is unresponsive.  Skin: Skin is warm. He is diaphoretic.  Nursing note and vitals reviewed.           Vital Signs: BP (!) 161/85 (BP Location: Right Arm)   Pulse (!) 116   Temp 98.6 F (37 C) (Oral)   Resp (!) 22   Ht 5\' 9"  (1.753 m)   Wt 88.7 kg (195 lb 8.8 oz)   SpO2 (!) 69%   BMI 28.88 kg/m  SpO2: SpO2: (!) 69 % O2 Device: O2 Device: Not Delivered O2 Flow Rate: O2 Flow Rate (L/min): 0.5 L/min  Intake/output summary:   Intake/Output Summary (Last 24 hours)  at 03/21/16 0956 Last data filed at 03/21/16 [id number]  Gross per 24 hour  Intake               70 ml  Output             2650 ml  Net            -2580 ml   LBM: Last BM Date: 03/20/16 Baseline Weight: Weight: 83.3 kg (183 lb 10.3 oz) Most recent weight: Weight: 88.7 kg (195 lb 8.8 oz)       Palliative Assessment/Data:  10%     Flowsheet Rows   Flowsheet Row Most Recent Value  Intake Tab  Referral Department  Hospitalist  Unit at Time of Referral  ICU  Palliative Care Primary Diagnosis  Sepsis/Infectious Disease  Date Notified  03/16/16  Palliative Care Type  New Palliative care  Reason for referral  Clarify Goals of Care, Psychosocial or Spiritual support  Date of Admission  03/02/16  Date first seen by Palliative Care  03/16/16  # of days Palliative referral response time  0 Day(s)  # of days IP prior to Palliative referral  14  Clinical Assessment  Palliative Performance Scale Score  20%  Pain Max last 24 hours  Not able to report  Pain Min Last 24 hours  Not able to report  Dyspnea Max Last 24 Hours  Not able to report  Dyspnea Min Last 24 hours  Not able to report  Nausea Max Last 24 Hours  Not able to report  Nausea Min Last 24 Hours  Not able to report  Anxiety Max Last 24 Hours  Not able to report  Anxiety  Min Last 24 Hours  Not able to report  Other Max Last 24 Hours  Not able to report  Psychosocial & Spiritual Assessment  Palliative Care Outcomes  Patient/Family meeting held?  Yes  Who was at the meeting?  wife  Palliative Care Outcomes  Clarified goals of care  Patient/Family wishes: Interventions discontinued/not started   PEG, Trach  Palliative Care follow-up planned  Yes, Facility      Patient Active Problem List   Diagnosis Date Noted  . DNR (do not resuscitate)   . Pressure injury of skin 03/16/2016  . Palliative care encounter   . AKI (acute kidney injury) (HCC)   . Empyema (HCC)   . Encounter for chest tube placement   . Encounter for central line care   . Acute respiratory failure with hypoxia (HCC) September 23, 2015    Palliative Care Assessment & Plan   Patient Profile: 70 y.o. male  with past medical history of Type 2 diabetes, MS (diagnosed 2 years ago) with behavioral changes, hypertension, BPH, chronic kidney disease, hyperlipidemia, CVA 01/31/2016 with left-sided deficits admitted on September 23, 2015 with shortness of breath while undergoing rehabilitation for his CVA.   He was diagnosed with pneumonia and bilateral pleural effusions. On 03/05/2016 patient had a syncopal episode while participating in physical therapy .  He was found to be hypotensive at the time, became febrile despite antibiotic coverage.   CTA of the chest revealed no PE but progression of multi lobar pneumonia compared to prior CT. On 11/14, patient required intubation secondary to his increased work of breathing. A . On 03/10/2016 chest tubes were placed, he came off pressors.   Patient was liberated from the vent yesterday and although he continues to breath on his own he is unresponsive and declining.  Prognosis is hours to days, wife supports  a full comfort path.    Recommendations/Plan:  - wife understands the limited prognosis, and supports decision for no further dialysis  -focus of care is  comfort -once family arrives for visit this morning will discuss transition to regular floor    Goals of Care and Additional Recommendations:  Limitations on Scope of Treatment: No Artificial Feeding, No Chemotherapy, No Hemodialysis, No Surgical Procedures and No Tracheostomy  Code Status:    Code Status Orders        Start     Ordered   2016/03/20 2136  Do not attempt resuscitation (DNR)  Continuous    Question Answer Comment  In the event of cardiac or respiratory ARREST Do not call a "code blue"   In the event of cardiac or respiratory ARREST Do not perform Intubation, CPR, defibrillation or ACLS   In the event of cardiac or respiratory ARREST Use medication by any route, position, wound care, and other measures to relive pain and suffering. May use oxygen, suction and manual treatment of airway obstruction as needed for comfort.      03-20-16 2139    Code Status History    Date Active Date Inactive Code Status Order ID Comments User Context   This patient has a current code status but no historical code status.       Prognosis:   Hours - Days after one way extubation.   Discharge Planning:  Anticipated Hospital Death  Thank you for allowing the Palliative Medicine Team to assist in the care of this patient. Discussed with Dr Hal Morales  Time In: 0840 Time Out: 0900 Total Time  20 min Prolonged Time Billed  no       Greater than 50%  of this time was spent counseling and coordinating care related to the above assessment and plan.  Lorinda Creed, NP  Please contact Palliative Medicine Team phone at [phone] for questions and concerns.

## 2016-03-22 NOTE — Progress Notes (Signed)
PROGRESS NOTE  Duane Harper [id number] DOB: 02/08/1946 DOA: 03/16/2016 PCP: Paulina FusiSCHULTZ,DOUGLAS E, MD   LOS: 13 days   Brief Narrative: 70 y.o. 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 Had left sided deficits from this CVA as well as cognitive changes. While undergoing rehab for his CVA, he developed dyspnea. Was diagnosed with pneumonia and bilateral pleural effusions. Patient was started on Levaquin and required supplemental O2. Patient's pleural fluid isolated Citrobacter. Fluid analysis suggested transudate process. Patient was treated with Lasix and antibiotics were broadened. On 11/13, patient had a syncopal episode while participating in PT. He was found to be hypotensive at that time. Patient continued to remain febrile with worsening hypoxia despite supplemental O2 and abx coverage. CTA chest obtained which was negative for PE and slight progression of severe multilobar PNA compared to prior CT. On 11/14, patient stated he desired intubation as his WOB was significant on BiPAP. Bronchoscopy was performed which showed no endobronchial lesions and minimal inflammation of the bronchi in the left upper lobe. Bronchial cultures showed normal oral respiratory flora. PCCM had discussion with wife regarding patient's wishes. She reports that he would not want to remain on ventilator for long term. He would not wish to have a tracheostomy or a PEG tube. His wish would be to be a DNR. He would not want chest compressions or defibrillation. He was transferred to floor on full comfort and TRH took over 11/29. Palliative following as well.   SIGNIFICANT EVENTS: 11/1: Ad[facility] to [facility] for hypoxia and dyspnea, found to have multilobar PNA and pleural effusions  11/6: Thoracentesis performed; culture grew Citrobacter  11/13: CTA chest negative for PE  11/4: Patient intubated  11/17: Patient started on Levophed, [facility]  11/18- Chest tubes  placed, came off pressors, line neck placed, fem dc'ed 11/20 - AKI. Nephro consulted 11/21- Anuria with worsening renal function 11/22-Spiked mild fever. Pan cultured. HD started  [facility] due to dyssynchrony with vent.  11/24- GOC with wife. Possible terminal wean on 03/19/2016 11/27-terminal extubation and full comfort care 11/28 proceed with comfort care.  Assessment & Plan: Active Problems:   Acute respiratory failure with hypoxia (HCC)   Encounter for central line care   Encounter for chest tube placement   Empyema (HCC)   AKI (acute kidney injury) (HCC)   Pressure injury of skin   Palliative care encounter   DNR (do not resuscitate)  Goals of care - Multiple discussions were held between the critical care MDs, palliative care and the family. Decision was made, per patient's wishes to stop aggressive interventions and focus on comfort. Anticipate in-hospital death. Acute Hypoxemic Hypercapneic respiratory failure  - due to empyema, B, with Citrobacter in pleural fluid (11/6) with Multilobar PNA - comfort care S/P Chest tube R and L, 11/18, with small apical PTX, bilateral - keep chest tube in for comfort H/o tobacco abuse, likely COPD  Terminal extubation and transition to comfort care 11/27 after discussion with family HTN  - Echo 11/20 with EF of 60-65%, mod LVH and PAPP 51 History of UE DVT  - LE doppler 11/20 without DVT AKI on CKD 2/2 sepsis/hypovolemia - with oliguria, Cr continues to rise, no further labs Acute anemia, s/p 1u PRBC with appropriate response (admission)  - Hgb stable since, now comfort Multilobar PNA / Empyema 2/2 to Citrobacter.  - Status full course of appropriate antibiotics  T2DM, controlled H/o MS  Recent CVA (10/17)   DVT prophylaxis: none Code Status: DNR Family Communication: no family bedside Disposition Plan: anticipate in-hospital death  Consultants:   PCCM  Palliative  Procedures:   ETT 11/14 >>    Right Femoral 11/17 >>11/18 Left IJ 11/18>>> Rt chest tuibe 11/18>>> Left chest tube 11/18>>> Foley 11/15>> HD cath in Rt IJ 11/22  Antimicrobials: Vancomycin 11/17 >> 11/20 Meropenem 11/17 >> 11/27  Subjective: - unresponsive, agonal breathing appears comfortable  Objective: Vitals:   03/20/16 1700 03/20/16 1721 03/21/16 0009 03/22/16 0550  BP:  (!) 158/71 (!) 161/85 (!) 141/59  Pulse: (!) 109 (!) 106 (!) 116 (!) 111  Resp: (!) 23 (!) 22 (!) 22 20  Temp:  98.6 F (37 C) 98.6 F (37 C) 98.6 F (37 C)  TempSrc:  Axillary Oral Oral  SpO2: (!) 80% (!) 82% (!) 69% (!) 67%  Weight:      Height:        Intake/Output Summary (Last 24 hours) at 03/22/16 1100 Last data filed at 03/22/16 0924  Gross per 24 hour  Intake             7.08 ml  Output             1878 ml  Net         -1870.92 ml   Filed Weights   03/17/16 0405 03/18/16 0100 03/19/16 0500  Weight: 89.2 kg (196 lb 10.4 oz) 88.7 kg (195 lb 8.8 oz) 88.7 kg (195 lb 8.8 oz)    Examination: Constitutional: unresponsive Vitals:   03/20/16 1700 03/20/16 1721 03/21/16 0009 03/22/16 0550  BP:  (!) 158/71 (!) 161/85 (!) 141/59  Pulse: (!) 109 (!) 106 (!) 116 (!) 111  Resp: (!) 23 (!) 22 (!) 22 20  Temp:  98.6 F (37 C) 98.6 F (37 C) 98.6 F (37 C)  TempSrc:  Axillary Oral Oral  SpO2: (!) 80% (!) 82% (!) 69% (!) 67%  Weight:      Height:         Data Reviewed: I have personally reviewed following labs and imaging studies  CBC:  Recent Labs Lab 03/16/16 0420 03/17/16 0500 03/18/16 0430 03/19/16 0505  WBC 8.0 9.4 14.0* 10.2  HGB 7.3* 8.5* 8.9* 8.4*  HCT 22.4* 26.0* 27.0* 25.3*  MCV 83.3 83.3 82.6 82.7  PLT 176 181 228 255   Basic Metabolic Panel:  Recent Labs Lab 03/16/16 0420 03/17/16 0500 03/18/16 0430 03/18/16 1200 03/19/16 0505  NA 137 136 134* 134* 137  K 5.1 4.9 5.8* 5.6* 5.4*  CL 101 99* 96* 98* 100*  CO2 24 26 23 23 23   GLUCOSE 141* 125* 141* 154* 134*  BUN 100* 74* 97* 103* 116*   CREATININE 9.68* 7.54* 8.18* 8.65* 9.21*  CALCIUM 7.8* 7.9* 8.1* 7.9* 7.9*  MG 2.6* 2.3 2.5*  --  2.6*  PHOS 9.9* 8.4* 9.5*  --  11.6*   GFR: Estimated Creatinine Clearance: 8.3 mL/min (by C-G formula based on SCr of 9.21 mg/dL (H)). Liver Function Tests: No results for input(s): AST, ALT, ALKPHOS, BILITOT, PROT, ALBUMIN in the last 168 hours. No results for input(s): LIPASE, AMYLASE in the last 168 hours. No results for input(s): AMMONIA in the last 168 hours. Coagulation Profile: No results for input(s): INR, PROTIME in the last 168 hours. Cardiac Enzymes: No results for input(s): CKTOTAL, CKMB, CKMBINDEX, TROPONINI in the last 168 hours. BNP (last 3 results) No results for input(s): PROBNP in the last 8760  hours. HbA1C: No results for input(s): HGBA1C in the last 72 hours. CBG:  Recent Labs Lab 03/19/16 0437 03/19/16 0730 03/19/16 1216 03/19/16 1717 03/19/16 1936  GLUCAP 127* 125* 131* 113* 114*   Lipid Profile: No results for input(s): CHOL, HDL, LDLCALC, TRIG, CHOLHDL, LDLDIRECT in the last 72 hours. Thyroid Function Tests: No results for input(s): TSH, T4TOTAL, FREET4, T3FREE, THYROIDAB in the last 72 hours. Anemia Panel: No results for input(s): VITAMINB12, FOLATE, FERRITIN, TIBC, IRON, RETICCTPCT in the last 72 hours. Urine analysis:    Component Value Date/Time   COLORURINE AMBER (A) Apr 05, 2016 1814   APPEARANCEUR TURBID (A) 04-05-2016 1814   LABSPEC 1.028 04-05-2016 1814   PHURINE 5.0 April 05, 2016 1814   GLUCOSEU NEGATIVE 04-05-2016 1814   HGBUR LARGE (A) 04/05/16 1814   BILIRUBINUR SMALL (A) 04-05-16 1814   KETONESUR 15 (A) 04/05/2016 1814   PROTEINUR 100 (A) 04/05/2016 1814   NITRITE NEGATIVE 04-05-16 1814   LEUKOCYTESUR SMALL (A) 04/05/2016 1814   Sepsis Labs: Invalid input(s): PROCALCITONIN, LACTICIDVEN  Recent Results (from the past 240 hour(s))  Culture, Urine     Status: None   Collection Time: 03/14/16 11:43 PM  Result Value Ref Range  Status   Specimen Description URINE, CATHETERIZED  Final   Special Requests NONE  Final   Culture NO GROWTH  Final   Report Status 03/16/2016 FINAL  Final  Culture, respiratory (NON-Expectorated)     Status: None   Collection Time: 03/14/16 11:43 PM  Result Value Ref Range Status   Specimen Description TRACHEAL ASPIRATE  Final   Special Requests NONE  Final   Gram Stain   Final    RARE SQUAMOUS EPITHELIAL CELLS PRESENT RARE WBC PRESENT,BOTH PMN AND MONONUCLEAR RARE BUDDING YEAST SEEN    Culture Consistent with normal respiratory flora.  Final   Report Status 03/17/2016 FINAL  Final  Culture, blood (Routine X 2) w Reflex to ID Panel     Status: None   Collection Time: 03/15/16 12:37 AM  Result Value Ref Range Status   Specimen Description BLOOD LEFT HAND  Final   Special Requests AEROBIC BOTTLE ONLY  Final   Culture NO GROWTH 5 DAYS  Final   Report Status 03/20/2016 FINAL  Final  Culture, blood (Routine X 2) w Reflex to ID Panel     Status: None   Collection Time: 03/15/16 12:43 AM  Result Value Ref Range Status   Specimen Description BLOOD RIGHT HAND  Final   Special Requests AEROBIC BOTTLE ONLY  Final   Culture NO GROWTH 5 DAYS  Final   Report Status 03/20/2016 FINAL  Final      Radiology Studies: No results found.   Scheduled Meds: . glycopyrrolate  0.4 mg Intravenous TID  . LORazepam  1 mg Intravenous Q6H   Continuous Infusions: . fentaNYL 50 mcg/hr (03/21/16 1035)    Pamella Pert, MD, PhD [facility] Pager [phone]  If 7PM-7AM, please contact night-coverage [email or web address] Password [email or web address] 03/22/2016, 11:00 AM

## 2016-03-23 MED ORDER — FENTANYL CITRATE (PF) 100 MCG/2ML IJ SOLN
50.0000 ug | INTRAMUSCULAR | Status: DC | PRN
  Administered 2016-03-23 – 2016-03-24 (×4): 50 ug via INTRAVENOUS
  Filled 2016-03-23 (×2): qty 2

## 2016-03-23 NOTE — Progress Notes (Signed)
  Patient wife request for palliative care team to come up and speak with her. Attempted to call over five times and left voicemail for someone to return call. Wife said that she had to leave. I passed report on to next nurse covering at 11am to ask if MD could call patients wife over the phone and speak with her.

## 2016-03-23 NOTE — Care Management Important Message (Signed)
Important Message  Patient Details  Name: EMRAH CORRIEA MRN: [id number] Date of Birth: [birth year]   Medicare Important Message Given:  Yes    Kyla Balzarine 03/23/2016, 1:21 PM

## 2016-03-23 NOTE — Progress Notes (Signed)
Patient resting comfortable at this time. 

## 2016-03-23 NOTE — Consult Note (Signed)
[facility] CM Primary Care Navigator  03/23/2016  Duane Harper 04-06-1946 [id number]  Met with patient's family briefly at the bedside and made needs known. Expressed that they still have been waiting to talk with palliative care team. Nursing staff reports attempts made to contact Palliative care team on different occasions. Unit leadership notified of family's concern with an attempt to follow-up.  MD note states that decision was made, per patient's wishes to stop aggressive interventions and focus on comfort. Anticipate in-hospital death.  For additional questions please contact:  Edwena Felty A. Garvey Westcott, BSN, RN-BC [facility] PRIMARY CARE Navigator Cell: [phone]

## 2016-03-23 NOTE — Progress Notes (Signed)
Palliative Medicine RN Note: rec'd message that RN called PMT cell phone; pt needs medication adjustments for symptom management. PMT office has had issues with phone lines today; after we were able to retrieve the message, I came within an hour to assess the pt.  RR 24-26, labored. Has not rec'd any prn doses of fentanyl and basal rate has not been increased. Obtained rx to increase basal from 50 to 75 mcg and to give prn dose as written now.  RN who had pt this am is not here; RN Scarlette Calico has pt now. Spoke with her about update/symptom management and need to give prn meds for dyspnea and tachypnea.  Most recent O2 sat was this am, 52%. Due to labile O2 sats, PMT does not recommend attempts to transfer out of the hospital.   Placed order stating that should the patient become symptomatic and the PRN medications are maxed out, please call PMT at [phone] from 7a-7p. However, outside of these hours or in the absence of a quick return call, please call primary for medication adjustments.  Called wife Sarthak Tonn to provide update. No answer, left VM with contact information.  Plan for PMT member to follow up on symptoms over the weekend.  Margret Chance Erman Thum, RN, BSN, [facility] 03/23/2016 12:52 PM Cell [phone] 8:00-4:00 Monday-Friday Office [phone]

## 2016-03-23 NOTE — Progress Notes (Signed)
Palliative Medicine RN Note: Rec'd call from pt's RN (his third nurse since this am). She reports that pt is comfortable on fentanyl drip, and she asked about whether pt should still get his Ativan and Robinul. Explained to her that the pt is actively, imminently dying and that no scheduled med should be held. She verbalized understanding.  Called Dr Linna Darner, PMT MD covering the weekend and updated her re: this call in case of further questions. Placed Care Order to ensure pt does not have missed doses.  Margret Chance Bradie Lacock, RN, BSN, [facility] 03/23/2016 4:57 PM Cell [phone] 8:00-4:00 Monday-Friday Office [phone]

## 2016-03-23 NOTE — Progress Notes (Signed)
PROGRESS NOTE  Duane Harper W Holecek [id number] DOB: 05/24/1945 DOA: 2015/05/17 PCP: Paulina FusiSCHULTZ,DOUGLAS E, MD   LOS: 14 days   Brief Narrative: 70 y.o. 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 Had left sided deficits from this CVA as well as cognitive changes. While undergoing rehab for his CVA, he developed dyspnea. Was diagnosed with pneumonia and bilateral pleural effusions. Patient was started on Levaquin and required supplemental O2. Patient's pleural fluid isolated Citrobacter. Fluid analysis suggested transudate process. Patient was treated with Lasix and antibiotics were broadened. On 11/13, patient had a syncopal episode while participating in PT. He was found to be hypotensive at that time. Patient continued to remain febrile with worsening hypoxia despite supplemental O2 and abx coverage. CTA chest obtained which was negative for PE and slight progression of severe multilobar PNA compared to prior CT. On 11/14, patient stated he desired intubation as his WOB was significant on BiPAP. Bronchoscopy was performed which showed no endobronchial lesions and minimal inflammation of the bronchi in the left upper lobe. Bronchial cultures showed normal oral respiratory flora. PCCM had discussion with wife regarding patient's wishes. She reports that he would not want to remain on ventilator for long term. He would not wish to have a tracheostomy or a PEG tube. His wish would be to be a DNR. He would not want chest compressions or defibrillation. He was transferred to floor on full comfort and TRH took over 11/29. Palliative following as well.   SIGNIFICANT EVENTS: 11/1: Ad[facility] for hypoxia and dyspnea, found to have multilobar PNA and pleural effusions  11/6: Thoracentesis performed; culture grew Citrobacter  11/13: CTA chest negative for PE  11/4: Patient intubated  11/17: Patient started on Levophed, [facility]  11/18- Chest tubes  placed, came off pressors, line neck placed, fem dc'ed 11/20 - AKI. Nephro consulted 11/21- Anuria with worsening renal function 11/22-Spiked mild fever. Pan cultured. HD started  [facility]23- [facility] due to dyssynchrony with vent.  11/24- GOC with wife. Possible terminal wean on 03/19/2016 11/27-terminal extubation and full comfort care 11/28 proceed with comfort care.  Assessment & Plan: Active Problems:   Acute respiratory failure with hypoxia (HCC)   Encounter for central line care   Encounter for chest tube placement   Empyema (HCC)   AKI (acute kidney injury) (HCC)   Pressure injury of skin   Palliative care encounter   DNR (do not resuscitate)  Goals of care - Multiple discussions were held between the critical care MDs, palliative care and the family. Decision was made, per patient's wishes to stop aggressive interventions and focus on comfort. Anticipate in-hospital death. Acute Hypoxemic Hypercapneic respiratory failure  - due to empyema, B, with Citrobacter in pleural fluid (11/6) with Multilobar PNA - comfort care S/P Chest tube R and L, 11/18, with small apical PTX, bilateral - keep chest tube in for comfort H/o tobacco abuse, likely COPD  Terminal extubation and transition to comfort care 11/27 after discussion with family HTN  - Echo 11/20 with EF of 60-65%, mod LVH and PAPP 51 History of UE DVT  - LE doppler 11/20 without DVT AKI on CKD 2/2 sepsis/hypovolemia - with oliguria, Cr continues to rise, no further labs Acute anemia, s/p 1u PRBC with appropriate response (admission)  - Hgb stable since, now comfort Multilobar PNA / Empyema 2/2 to Citrobacter.  - Status full course of appropriate antibiotics  T2DM, controlled H/o MS  Recent CVA (10/17)   DVT prophylaxis: none Code Status: DNR Family Communication: no family bedside Disposition Plan: anticipate in-hospital death  Consultants:   PCCM  Palliative  Procedures:   ETT 11/14 >>    Right Femoral 11/17 >>11/18 Left IJ 11/18>>> Rt chest tuibe 11/18>>> Left chest tube 11/18>>> Foley 11/15>> HD cath in Rt IJ 11/22  Antimicrobials: Vancomycin 11/17 >> 11/20 Meropenem 11/17 >> 11/27  Subjective: - unresponsive, agonal breathing appears comfortable  Objective: Vitals:   03/21/16 0009 03/22/16 0550 03/22/16 1500 03/23/16 0615  BP: (!) 161/85 (!) 141/59 (!) 138/56 138/86  Pulse: (!) 116 (!) 111 (!) 106 (!) 122  Resp: (!) 22 20 20 20   Temp: 98.6 F (37 C) 98.6 F (37 C) 98.6 F (37 C) 99.8 F (37.7 C)  TempSrc: Oral Oral Axillary Axillary  SpO2: (!) 69% (!) 67% (!) 82% (!) 52%  Weight:      Height:        Intake/Output Summary (Last 24 hours) at 03/23/16 1345 Last data filed at 03/23/16 1019  Gross per 24 hour  Intake                0 ml  Output             1800 ml  Net            -1800 ml   Filed Weights   03/17/16 0405 03/18/16 0100 03/19/16 0500  Weight: 89.2 kg (196 lb 10.4 oz) 88.7 kg (195 lb 8.8 oz) 88.7 kg (195 lb 8.8 oz)    Examination: Constitutional: unresponsive Vitals:   03/21/16 0009 03/22/16 0550 03/22/16 1500 03/23/16 0615  BP: (!) 161/85 (!) 141/59 (!) 138/56 138/86  Pulse: (!) 116 (!) 111 (!) 106 (!) 122  Resp: (!) 22 20 20 20   Temp: 98.6 F (37 C) 98.6 F (37 C) 98.6 F (37 C) 99.8 F (37.7 C)  TempSrc: Oral Oral Axillary Axillary  SpO2: (!) 69% (!) 67% (!) 82% (!) 52%  Weight:      Height:         Data Reviewed: I have personally reviewed following labs and imaging studies  CBC:  Recent Labs Lab 03/17/16 0500 03/18/16 0430 03/19/16 0505  WBC 9.4 14.0* 10.2  HGB 8.5* 8.9* 8.4*  HCT 26.0* 27.0* 25.3*  MCV 83.3 82.6 82.7  PLT 181 228 255   Basic Metabolic Panel:  Recent Labs Lab 03/17/16 0500 03/18/16 0430 03/18/16 1200 03/19/16 0505  NA 136 134* 134* 137  K 4.9 5.8* 5.6* 5.4*  CL 99* 96* 98* 100*  CO2 26 23 23 23   GLUCOSE 125* 141* 154* 134*  BUN 74* 97* 103* 116*  CREATININE 7.54* 8.18* 8.65*  9.21*  CALCIUM 7.9* 8.1* 7.9* 7.9*  MG 2.3 2.5*  --  2.6*  PHOS 8.4* 9.5*  --  11.6*   GFR: Estimated Creatinine Clearance: 8.3 mL/min (by C-G formula based on SCr of 9.21 mg/dL (H)). Liver Function Tests: No results for input(s): AST, ALT, ALKPHOS, BILITOT, PROT, ALBUMIN in the last 168 hours. No results for input(s): LIPASE, AMYLASE in the last 168 hours. No results for input(s): AMMONIA in the last 168 hours. Coagulation Profile: No results for input(s): INR, PROTIME in the last 168 hours. Cardiac Enzymes: No results for input(s): CKTOTAL, CKMB, CKMBINDEX, TROPONINI in the last 168 hours. BNP (last 3 results) No results for input(s): PROBNP in the last 8760 hours. HbA1C: No results for input(s): HGBA1C in the last 72  hours. CBG:  Recent Labs Lab 03/19/16 0437 03/19/16 0730 03/19/16 1216 03/19/16 1717 03/19/16 1936  GLUCAP 127* 125* 131* 113* 114*   Lipid Profile: No results for input(s): CHOL, HDL, LDLCALC, TRIG, CHOLHDL, LDLDIRECT in the last 72 hours. Thyroid Function Tests: No results for input(s): TSH, T4TOTAL, FREET4, T3FREE, THYROIDAB in the last 72 hours. Anemia Panel: No results for input(s): VITAMINB12, FOLATE, FERRITIN, TIBC, IRON, RETICCTPCT in the last 72 hours. Urine analysis:    Component Value Date/Time   COLORURINE AMBER (A) 02/29/2016 1814   APPEARANCEUR TURBID (A) 02/26/2016 1814   LABSPEC 1.028 02/25/2016 1814   PHURINE 5.0 03/18/2016 1814   GLUCOSEU NEGATIVE 02/22/2016 1814   HGBUR LARGE (A) 03/06/2016 1814   BILIRUBINUR SMALL (A) 02/28/2016 1814   KETONESUR 15 (A) 03/14/2016 1814   PROTEINUR 100 (A) 03/17/2016 1814   NITRITE NEGATIVE 03/03/2016 1814   LEUKOCYTESUR SMALL (A) 03/11/2016 1814   Sepsis Labs: Invalid input(s): PROCALCITONIN, LACTICIDVEN  Recent Results (from the past 240 hour(s))  Culture, Urine     Status: None   Collection Time: 03/14/16 11:43 PM  Result Value Ref Range Status   Specimen Description URINE, CATHETERIZED   Final   Special Requests NONE  Final   Culture NO GROWTH  Final   Report Status 03/16/2016 FINAL  Final  Culture, respiratory (NON-Expectorated)     Status: None   Collection Time: 03/14/16 11:43 PM  Result Value Ref Range Status   Specimen Description TRACHEAL ASPIRATE  Final   Special Requests NONE  Final   Gram Stain   Final    RARE SQUAMOUS EPITHELIAL CELLS PRESENT RARE WBC PRESENT,BOTH PMN AND MONONUCLEAR RARE BUDDING YEAST SEEN    Culture Consistent with normal respiratory flora.  Final   Report Status 03/17/2016 FINAL  Final  Culture, blood (Routine X 2) w Reflex to ID Panel     Status: None   Collection Time: 03/15/16 12:37 AM  Result Value Ref Range Status   Specimen Description BLOOD LEFT HAND  Final   Special Requests AEROBIC BOTTLE ONLY  Final   Culture NO GROWTH 5 DAYS  Final   Report Status 03/20/2016 FINAL  Final  Culture, blood (Routine X 2) w Reflex to ID Panel     Status: None   Collection Time: 03/15/16 12:43 AM  Result Value Ref Range Status   Specimen Description BLOOD RIGHT HAND  Final   Special Requests AEROBIC BOTTLE ONLY  Final   Culture NO GROWTH 5 DAYS  Final   Report Status 03/20/2016 FINAL  Final      Radiology Studies: No results found.   Scheduled Meds: . glycopyrrolate  0.4 mg Intravenous TID  . LORazepam  1 mg Intravenous Q6H   Continuous Infusions: . fentaNYL 75 mcg/hr (03/23/16 1258)    Pamella Pert, MD, PhD [facility] Pager [phone]  If 7PM-7AM, please contact night-coverage [email or web address] Password [email or web address] 03/23/2016, 1:45 PM

## 2016-03-23 NOTE — Progress Notes (Signed)
Palliative Medicine RN Note: Pt still alive per notes. Spoke w RN; pt comfortable, no family at bedside.  Margret Chance Artyom Stencel, RN, BSN, [facility] 03/23/2016 3:33 PM Cell [phone] 8:00-4:00 Monday-Friday Office [phone]

## 2016-03-23 DEATH — deceased

## 2016-03-24 DIAGNOSIS — Z515 Encounter for palliative care: Secondary | ICD-10-CM

## 2016-03-24 MED ORDER — SODIUM CHLORIDE 0.9% FLUSH: 10.0000 mL | INTRAVENOUS | Status: DC | PRN

## 2016-03-25 ENCOUNTER — Encounter (HOSPITAL_COMMUNITY): Payer: Self-pay | Admitting: *Deleted

## 2016-03-30 ENCOUNTER — Telehealth: Payer: Self-pay | Admitting: Diagnostic Neuroimaging

## 2016-03-30 NOTE — Telephone Encounter (Signed)
error 

## 2016-04-05 DIAGNOSIS — R0600 Dyspnea, unspecified: Secondary | ICD-10-CM

## 2016-04-23 NOTE — Progress Notes (Signed)
Offered to call Chaplain for the family but they stated they did not need that right now.

## 2016-04-23 NOTE — Progress Notes (Addendum)
Fentanyl gtt 200 mL wasted in sink. Witnessed by Julieanne Cotton, Charity fundraiser.

## 2016-04-23 NOTE — Progress Notes (Signed)
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 Had left sided deficits from this CVA as well as cognitive changes. While undergoing rehab for his CVA, he developed dyspnea. Was diagnosed with pneumonia and bilateral pleural effusions. Patient was started on Levaquin and required supplemental O2. Patient's pleural fluid isolated Citrobacter. Fluid analysis suggested transudate process. Patient was treated with Lasix and antibiotics were broadened. On 11/13, patient had a syncopal episode while participating in PT. He was found to be hypotensive at that time. Patient continued to remain febrile with worsening hypoxia despite supplemental O2 and abx coverage. CTA chest obtained which was negative for PE and slight progression of severe multilobar PNA compared to prior CT. On 11/14, patient stated he desired intubation as his WOB was significant on BiPAP. Bronchoscopy was performed which showed no endobronchial lesions and minimal inflammation of the bronchi in the left upper lobe. Bronchial cultures showed normal oral respiratory flora. PCCM had discussion with wife regarding patient's wishes. She reports that he would not want to remain on ventilator for long term. He would not wish to have a tracheostomy or a PEG tube. His wish would be to be a DNR. He would not want chest compressions or defibrillation. He was transferred to floor on full comfort and TRH took over 11/29. Palliative following as well.   SIGNIFICANT EVENTS: 11/1: Admitted to [facility] for hypoxia and dyspnea, found to have multilobar PNA and pleural effusions  11/6: Thoracentesis performed; culture grew Citrobacter  11/13: CTA chest negative for PE  11/4: Patient intubated  11/17: Patient started on Levophed, transferred to [facility]  11/18- Chest tubes placed, came off  pressors, line neck placed, fem dc'ed 11/20 - AKI. Nephro consulted 11/21- Anuria with worsening renal function 11/22-Spiked mild fever. Pan cultured. HD started  11/23- changed from [facility] to PCV due to dyssynchrony with vent.  11/24- [place] with wife. Possible terminal wean on 03/19/2016 11/27-terminal extubation and full comfort care 11/28 proceed with comfort care.  Assessment & Plan:   Active Problems:   Acute respiratory failure with hypoxia (HCC)   Encounter for central line care   Encounter for chest tube placement   Empyema ([place])   AKI (acute kidney injury) ([place])   Pressure injury of skin   Palliative care encounter   DNR (do not resuscitate)  Goals of care: as per prior medical records multiple discussions were held between the critical care MDs, palliative care and the family. Decision was made, per patient's wishes to stop aggressive interventions and focus on comfort. Anticipate in-hospital death. I met with patient's wife, daughter, son in law and grand-daughter at bedside. Emotional support provided. Continue fentany drip, ativan prn as comfort care. Palliative consult appreciated.   Acute Hypoxemic Hypercapneic respiratory failure  - due to empyema, B, with Citrobacter in pleural fluid (11/6) with Multilobar PNA - comfort care -S/P Chest tube R and L, 11/18, with small apical PTX, bilateral - continue chest tube in for comfort H/o tobacco abuse, likely COPD  Terminal extubation and transition to comfort care 11/27 after discussion with family HTN  - Echo 11/20 with EF of 60-65%, mod LVH and PAPP 51 History of UE DVT  - LE doppler 11/20 without DVT AKI on CKD 2/2 sepsis/hypovolemia - with oliguria, Cr continues to rise, no further labs. Will remove temporary dialysis  catheter today.  Acute anemia, s/p 1u PRBC with appropriate response (admission)  -no further lab. Multilobar PNA / Empyema 2/2 to Citrobacter.  - Status full course of appropriate antibiotics  T2DM,  controlled H/o MS  Recent CVA (10/17)   DVT prophylaxis:comfort care Code Status:DNR comfort Family Communication: Discussed with the patient's wife, daughter, son-in-law and granddaughter at bedside Disposition Plan: Anticipate in-hospital death versus hospice discharge.   Consultants:   Critical care, palliative care  Procedures:   ETT 11/14 >> Right Femoral 11/17 >>11/18 Left IJ 11/18>>> Rt chest tuibe 11/18>>> Left chest tube 11/18>>> Foley 11/15>> HD cath in Rt IJ 11/22  Antimicrobials: Vancomycin 11/17 >> 11/20 Meropenem 11/17 >>11/27  Subjective: Unresponsive, mild tachypea. Overall looks comfortable.   Objective: Vitals:   03/22/16 1500 03/23/16 0615 2016/04/23 0042 23-Apr-2016 0500  BP: (!) 138/56 138/86 (!) 127/59 (!) 133/57  Pulse: (!) 106 (!) 122 (!) 113 (!) 112  Resp: 20 20 (!) 21 19  Temp: 98.6 F (37 C) 99.8 F (37.7 C) 99 F (37.2 C) 98 F (36.7 C)  TempSrc: Axillary Axillary Axillary Oral  SpO2: (!) 82% (!) 52% (!) 77% (!) 81%  Weight:      Height:        Intake/Output Summary (Last 24 hours) at Apr 23, 2016 1554 Last data filed at 04/23/16 1502  Gross per 24 hour  Intake               75 ml  Output             1160 ml  Net            -1085 ml   Filed Weights   03/17/16 0405 03/18/16 0100 03/19/16 0500  Weight: 89.2 kg (196 lb 10.4 oz) 88.7 kg (195 lb 8.8 oz) 88.7 kg (195 lb 8.8 oz)    Examination:  General exam:unresponsiveness  Respiratory system: Clear b/l, tachypnea Cardiovascular system: S1 & S2 heard, RRR.   Data Reviewed: I have personally reviewed following labs and imaging studies  CBC:  Recent Labs Lab 03/18/16 0430 03/19/16 0505  WBC 14.0* 10.2  HGB 8.9* 8.4*  HCT 27.0* 25.3*  MCV 82.6 82.7  PLT 228 599   Basic Metabolic Panel:  Recent Labs Lab 03/18/16 0430 03/18/16 1200 03/19/16 0505  NA 134* 134* 137  K 5.8* 5.6* 5.4*  CL 96* 98* 100*  CO2 23 23 23   GLUCOSE 141* 154* 134*  BUN 97* 103* 116*    CREATININE 8.18* 8.65* 9.21*  CALCIUM 8.1* 7.9* 7.9*  MG 2.5*  --  2.6*  PHOS 9.5*  --  11.6*   GFR: Estimated Creatinine Clearance: 8.3 mL/min (by C-G formula based on SCr of 9.21 mg/dL (H)). Liver Function Tests: No results for input(s): AST, ALT, ALKPHOS, BILITOT, PROT, ALBUMIN in the last 168 hours. No results for input(s): LIPASE, AMYLASE in the last 168 hours. No results for input(s): AMMONIA in the last 168 hours. Coagulation Profile: No results for input(s): INR, PROTIME in the last 168 hours. Cardiac Enzymes: No results for input(s): CKTOTAL, CKMB, CKMBINDEX, TROPONINI in the last 168 hours. BNP (last 3 results) No results for input(s): PROBNP in the last 8760 hours. HbA1C: No results for input(s): HGBA1C in the last 72 hours. CBG:  Recent Labs Lab 03/19/16 0437 03/19/16 0730 03/19/16 1216 03/19/16 1717 03/19/16 1936  GLUCAP 127* 125* 131* 113* 114*   Lipid Profile: No results for input(s): CHOL, HDL, LDLCALC, TRIG, CHOLHDL, LDLDIRECT in the last 72 hours.  Thyroid Function Tests: No results for input(s): TSH, T4TOTAL, FREET4, T3FREE, THYROIDAB in the last 72 hours. Anemia Panel: No results for input(s): VITAMINB12, FOLATE, FERRITIN, TIBC, IRON, RETICCTPCT in the last 72 hours. Sepsis Labs: No results for input(s): PROCALCITON, LATICACIDVEN in the last 168 hours.  Recent Results (from the past 240 hour(s))  Culture, Urine     Status: None   Collection Time: 03/14/16 11:43 PM  Result Value Ref Range Status   Specimen Description URINE, CATHETERIZED  Final   Special Requests NONE  Final   Culture NO GROWTH  Final   Report Status 03/16/2016 FINAL  Final  Culture, respiratory (NON-Expectorated)     Status: None   Collection Time: 03/14/16 11:43 PM  Result Value Ref Range Status   Specimen Description TRACHEAL ASPIRATE  Final   Special Requests NONE  Final   Gram Stain   Final    RARE SQUAMOUS EPITHELIAL CELLS PRESENT RARE WBC PRESENT,BOTH PMN AND  MONONUCLEAR RARE BUDDING YEAST SEEN    Culture Consistent with normal respiratory flora.  Final   Report Status 03/17/2016 FINAL  Final  Culture, blood (Routine X 2) w Reflex to ID Panel     Status: None   Collection Time: 03/15/16 12:37 AM  Result Value Ref Range Status   Specimen Description BLOOD LEFT HAND  Final   Special Requests AEROBIC BOTTLE ONLY 5ML  Final   Culture NO GROWTH 5 DAYS  Final   Report Status 03/20/2016 FINAL  Final  Culture, blood (Routine X 2) w Reflex to ID Panel     Status: None   Collection Time: 03/15/16 12:43 AM  Result Value Ref Range Status   Specimen Description BLOOD RIGHT HAND  Final   Special Requests AEROBIC BOTTLE ONLY 6ML  Final   Culture NO GROWTH 5 DAYS  Final   Report Status 03/20/2016 FINAL  Final         Radiology Studies: No results found.      Scheduled Meds: . glycopyrrolate  0.4 mg Intravenous TID  . LORazepam  1 mg Intravenous Q6H   Continuous Infusions: . fentaNYL 75 mcg/hr (03-26-16 1205)     LOS: 15 days    Duane Harper Tanna Furry, MD [facility] Pager [phone]  If 7PM-7AM, please contact night-coverage [email or web address] Password [facility] 03/26/16, 3:54 PM

## 2016-04-23 NOTE — Discharge Summary (Addendum)
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 Had left sided deficits from this CVA as well as cognitive changes. While undergoing rehab for his CVA, he developed dyspnea. Was diagnosed with pneumonia and bilateral pleural effusions. Patient was started on Levaquin and required supplemental O2. Patient's pleural fluid isolated Citrobacter. Fluid analysis suggested transudate process. Patient was treated with Lasix and antibiotics were broadened. On 11/13, patient had a syncopal episode while participating in PT. He was found to be hypotensive at that time. Patient continued to remain febrile with worsening hypoxia despite supplemental O2 and abx coverage. CTA chest obtained which was negative for PE and slight progression of severe multilobar PNA compared to prior CT. On 11/14, patient stated he desired intubation as his WOB was significant on BiPAP. Bronchoscopy was performed which showed no endobronchial lesions and minimal inflammation of the bronchi in the left upper lobe. Bronchial cultures showed normal oral respiratory flora. PCCM had discussion with wife regarding patient's wishes. She reports that he would not want to remain on ventilator for long term. He would not wish to have a tracheostomy or a PEG tube. His wish would be to be a DNR. He would not want chest compressions or defibrillation. He was transferred to floor on full comfort and TRH took over 11/29. Palliative following as well.   SIGNIFICANT EVENTS: 11/1: Admitted to [facility] for hypoxia and dyspnea, found to have multilobar PNA and pleural effusions  11/6: Thoracentesis performed; culture grew  Citrobacter  11/13: CTA chest negative for PE  11/4: Patient intubated  2023-04-05: Patient started on Levophed, transferred to [facility]  11/18- Chest tubes placed, came off pressors, line neck placed, fem dc'ed 11/20 - AKI. Nephro consulted 11/21- Anuria with worsening renal function 11/22-Spiked mild fever. Pan cultured. HD started  11/23- changed from [facility] to PCV due to dyssynchrony with vent.  11/24- GOC with wife. Possible terminal wean on 03/19/2016 11/27-terminal extubation and full comfort care 11/28 proceed with comfort care.  Goals of care - Multiple discussions were held between the critical care MDs, palliative care and the family. Decision was made, per patient's wishes to stop aggressive interventions and focus on comfort.  Acute Hypoxemic Hypercapneic respiratory failure  - due to empyema, B, with Citrobacter in pleural fluid (11/6) with Multilobar PNA S/P Chest tube R and L, 11/18, with small apical PTX, bilateral H/o tobacco abuse, likely COPD  Terminal extubation and transition to comfort care 11/27 after discussion with family HTN  - Echo 11/20 with EF of 60-65%, mod LVH and PAPP 51 History of UE DVT  - LE doppler 11/20 without DVT AKI on CKD II due to sepsis/hypovolemia Acute anemia, s/p 1u PRBC with appropriate response (admission)  Multilobar PNA / Empyema 2/2 to Citrobacter - Status full course of appropriate antibiotics  T2DM, controlled H/o MS  Recent CVA (10/17)   The results of significant diagnostics from this hospitalization (including imaging, microbiology, ancillary and laboratory) are listed below for reference.    Significant Diagnostic Studies: Ct Chest Wo Contrast  Result Date: 03/10/2016 CLINICAL DATA:  Acute respiratory failure EXAM: CT CHEST WITHOUT CONTRAST TECHNIQUE: Multidetector CT imaging of the chest was performed following the standard protocol  without IV contrast. COMPARISON:  03/06/2016 FINDINGS: Cardiovascular: Atherosclerotic  calcifications in the aorta and great vessels are stable. Coronary artery calcifications are present involving all 3 coronary arteries. No evidence of ascending aortic aneurysm or intramural hematoma. Prominent aortic valvular calcifications are present. Mediastinum/Nodes: Abnormal mediastinal adenopathy is unchanged. 1.9 cm paratracheal node on image 42 is stable. Other prominent mediastinal nodes are present. No evidence of pericardial effusion. No evidence of pneumomediastinum. Tracheostomy tube is stable. NG tube tip is in the fundus of the stomach. Thyroid is unremarkable. There is stranding within the fat of the mediastinum. This does extend around the subclavian and axillary vessels. Lungs/Pleura: Heterogeneous opacities throughout both lungs are not significantly changed. Is characterized by areas of interstitial lung disease, ground-glass, and confluent consolidation with dependent air bronchograms. No obvious endobronchial airway lesion can be identified. Airways are grossly patent. Moderate bilateral pleural effusions have increased in size. Upper Abdomen: The gallbladder is distended. There is abnormal adenopathy in the gastrohepatic ligament and periportal regions. A small amount of free fluid is present anterior to the liver. Musculoskeletal: No vertebral compression deformity. IMPRESSION: Extensive bilateral pulmonary opacities worrisome for an inflammatory process, ARDS, or edema are stable. Malignancy is not excluded. Moderate bilateral pleural effusions have increased. Small amount of free fluid is seen anterior to the liver. Mediastinal and upper abdominal adenopathy is nonspecific. Electronically Signed   By: Jolaine ClickArthur  Hoss M.D.   On: 03/10/2016 11:14   Dg Chest Port 1 View  Result Date: 03/17/2016 CLINICAL DATA:  Respiratory failure EXAM: PORTABLE CHEST 1 VIEW COMPARISON:  03/19/2016 FINDINGS: Cardiac shadow is stable. Endotracheal tube, nasogastric catheter, left jugular and right jugular  central lines are again seen and stable. The small pneumothorax seen on the right is again identified and stable. The left pneumothorax has resolved in the interval. Biapical infiltrates are seen. Left basilar infiltrate is noted as well. Bilateral chest tubes are again seen. No new focal abnormality is seen. IMPRESSION: Resolution of left pneumothorax. Persistent right pneumothorax and biapical infiltrates. Persistent left basilar changes are noted. Electronically Signed   By: Alcide CleverMark  Lukens M.D.   On: 03/17/2016 07:32   Dg Chest Port 1 View  Result Date: 03/16/2016 CLINICAL DATA:  Intubation. EXAM: PORTABLE CHEST 1 VIEW COMPARISON:  03/15/2016. FINDINGS: Endotracheal tube, right IJ line, left IJ line, bilateral chest tubes in stable position. A low lung volumes. Stable bilateral small pneumothoraces again noted. Diffuse bilateral airspace disease again noted. IMPRESSION: 1. Lines and tubes including bilateral chest tubes in stable position. 2.  Persistent unchanged small bilateral pneumothoraces. 3. Low lung volumes. Diffuse bilateral airspace disease again noted. Electronically Signed   By: Maisie Fushomas  Register   On: 03/16/2016 06:54   Dg Chest Port 1 View  Result Date: 03/15/2016 CLINICAL DATA:  Pneumothorax. EXAM: PORTABLE CHEST 1 VIEW COMPARISON:  One-view chest x-ray 03/14/2016. FINDINGS: The endotracheal tube is stable. The NG tube has advanced and now extends off the inferior border the film. Bilateral IJ catheters are in place. Bilateral chest tubes are in place. Small bilateral pneumothoraces are evident without significant interval change. Bilateral upper lobe airspace disease is again seen. Medial right lower lobe airspace disease is seen. IMPRESSION: 1. No significant interval change in bilateral pneumothoraces. 2. Support apparatus including bilateral chest tubes are stable. 3. Similar appearance of bilateral patchy airspace disease. Electronically Signed   By: Marin Robertshristopher  Mattern M.D.   On:  03/15/2016 07:06   Dg Chest Port 1 View  Result Date: 03/14/2016 CLINICAL DATA:  Hypoxia.  New central catheter placement EXAM: PORTABLE CHEST 1 VIEW COMPARISON:  Study obtained earlier in the day FINDINGS: Right jugular catheter tip is in the superior vena cava. Left jugular catheter tip is in the left innominate vein. Endotracheal tube tip is 3.0 cm above the carina. Nasogastric tube tip is now in the lower esophagus. There are chest tubes on either side. There is a persistent small apical pneumothorax on the right. There is a small apical pneumothorax on the left as well. These pneumothoraces appear stable. There is persistent airspace consolidation in both upper lobes and left base regions. No new opacity. Heart size is normal. Pulmonary vascularity is normal. No evident adenopathy. There is atherosclerotic calcification in the aorta. IMPRESSION: Tube and catheter positions as described without pneumothorax. Note that the nasogastric tube tip is now in the distal esophagus. Advise advancing nasogastric tube at least 15 cm to insure that nasogastric tube tip and side port are in the stomach. Small pneumothoraces bilaterally, stable. No tension components. Areas of airspace consolidation in both upper lobes and left base, stable. Stable cardiac silhouette. There is aortic atherosclerosis. These results will be called to the ordering clinician or representative by the Radiologist Assistant, and communication documented in the PACS or zVision Dashboard. Electronically Signed   By: Bretta Bang III M.D.   On: 03/14/2016 12:52   Dg Chest Port 1 View  Result Date: 03/14/2016 CLINICAL DATA:  Evaluate for pneumothorax EXAM: PORTABLE CHEST 1 VIEW COMPARISON:  03/13/2016 FINDINGS: Cardiac shadow is stable. An endotracheal tube, nasogastric catheter and left jugular central line are again seen and stable. Bilateral chest tubes are again identified and stable. Previously seen small pneumothoraces are again  identified and stable. Persistent upper lobe infiltrates are seen. No new focal abnormality is noted. IMPRESSION: Persistent small pneumothoraces with bilateral upper lobe infiltrates stable from the prior exam. Tubes and lines as described. Electronically Signed   By: Alcide Clever M.D.   On: 03/14/2016 07:40   Dg Chest Port 1 View  Result Date: 03/13/2016 CLINICAL DATA:  Possible pneumothorax EXAM: PORTABLE CHEST 1 VIEW COMPARISON:  Chest x-ray of 03/13/2016 FINDINGS: There are small bilateral pneumothoraces present better seen over the apices. Bilateral chest tubes remain. Parenchymal opacities remain in the upper lobes. Endotracheal tube appears to be in good position approximately 5.1 cm above the carina. Left IJ central venous line tip overlies the left innominate vein. Heart size is stable. Coarse lung markings are noted at the left lung base. IMPRESSION: 1. Little change in small bilateral primarily apical pneumothoraces with bilateral chest tubes remaining. 2. No change in primarily upper lobe parenchymal opacities bilaterally. 3. Endotracheal tube tip 5.1 cm above the carina Electronically Signed   By: Dwyane Dee M.D.   On: 03/13/2016 15:14   Dg Chest Port 1 View  Result Date: 03/13/2016 CLINICAL DATA:  Hypoxia EXAM: PORTABLE CHEST 1 VIEW COMPARISON:  March 12, 2016 FINDINGS: Endotracheal tube tip is 4.3 cm above the carina. Central catheter tip is in the left innominate vein. Nasogastric tube tip and side port are below the diaphragm. There is a chest tube on each side, unchanged in position. There is a persistent small right pneumothorax without tension component. There is also a stable small left apical pneumothorax without tension. There is airspace consolidation in both upper lobes, stable. There is also patchy airspace opacity in the left base. Lungs elsewhere clear. Heart is upper normal in size pulmonary vascularity within normal limits. No adenopathy. There is atherosclerotic  calcification in the aorta. IMPRESSION: Tube and catheter positions are unchanged. Small apical pneumothorax on each side persist. No tension component. Airspace consolidation in both upper lobes and left base are stable. No new opacity. Stable cardiac silhouette. There is aortic atherosclerosis. Electronically Signed   By: Bretta Bang III M.D.   On: 03/13/2016 07:11   Dg Chest Port 1 View  Result Date: 03/12/2016 CLINICAL DATA:  Right-sided chest tube, acute respiratory failure EXAM: PORTABLE CHEST 1 VIEW COMPARISON:  Portable chest x-ray of March 12, 2016 FINDINGS: The patient is rotated toward the left. The lungs are reasonably well inflated. There are confluent alveolar opacities in the upper lobes. Patchy densities are present at both lower lobes. A faint pleural line persists in the right apex. The right-sided chest tube tip lies just above the posterior aspect of the eighth rib and is stable. The left-sided chest tube lies at the base of the pleural space with the tip in the overlying the medial cardio phrenic angle. No left-sided pneumothorax is clearly evident. The cardiac silhouette is normal in size. The pulmonary vascularity is indistinct. The endotracheal tube tip lies 5.3 cm above the carina. The esophagogastric tube tip projects below the inferior margin of the image. The left internal jugular venous catheter tip projects at the junction of the internal jugular vein with the subclavian vein on the left. IMPRESSION: Fairly stable appearance of the chest. Small right apical pneumothorax. Persistent bilateral lateral upper lobe infiltrates with mild bibasilar atelectasis or infiltrate. The support tubes are in reasonable position. Electronically Signed   By: David  [place] M.D.   On: 03/12/2016 10:18   Dg Chest Port 1 View  Result Date: 03/12/2016 CLINICAL DATA:  Pneumonia, acute respiratory failure, intubated patient. Current smoker EXAM: PORTABLE CHEST 1 VIEW COMPARISON:  Portable  chest x-ray of March 11, 2016 FINDINGS: The lungs are adequately inflated. The interstitial and alveolar infiltrates persist but are not significantly changed. There is partial obscuration of the left hemidiaphragm. Bilateral chest tubes are in stable position. A tiny less than 5% apical pneumothorax on the right cannot be excluded. The heart is normal in size. The central pulmonary vascularity is prominent. The endotracheal tube tip lies 3.9 cm above the carina. The esophagogastric tube tip in proximal port project below the inferior margin of the image. The left internal jugular venous catheter tip projects over the proximal SVC. IMPRESSION: Stable bilateral interstitial and alveolar opacities compatible with pneumonia. No definite CHF. Possible 5% or less apical pneumothorax on the right. The chest tubes are in stable position. Electronically Signed   By: David  [place] M.D.   On: 03/12/2016 07:10   Dg Chest Port 1 View  Result Date: 03/11/2016 CLINICAL DATA:  71 year old male with a history of a pneumonia and subsequent empyema. Thoracostomy tubes 03/10/2016. EXAM: PORTABLE CHEST 1 VIEW COMPARISON:  03/10/2016, chest CT 03/10/2016 FINDINGS: Cardiomediastinal silhouette unchanged with double density overlying the right greater than left hilar region. Endotracheal tube relatively unchanged, terminating approximately 4.9 cm above the carina. Unchanged left IJ approach central venous catheter which terminates near the confluence of the SVC and left brachiocephalic vein. Unchanged gastric tube terminating out of the field of view. Overlying EKG leads. Unchanged position of bilateral large bore thoracostomy tubes terminating near the midline. Bilateral mixed interstitial and airspace opacities, without significant change from the comparison plain film. Blunting of the right cardiophrenic angle, left cardiophrenic angle, and the left costophrenic angle. No visualized pneumothorax. IMPRESSION: Mixed bilateral  interstitial and airspace  opacity, without significant change from the comparison. Unchanged bilateral thoracostomy tubes. No significant residual pleural effusion. Unchanged endotracheal tube, left IJ central venous catheter, and gastric tube. Signed, Yvone Neu. Loreta Ave, DO [facility] Electronically Signed   By: Gilmer Mor D.O.   On: 03/11/2016 14:17   Dg Chest Port 1 View  Result Date: 03/10/2016 CLINICAL DATA:  Central line placement.  Initial encounter. EXAM: PORTABLE CHEST 1 VIEW COMPARISON:  Chest radiograph performed earlier today at 4:53 p.m. FINDINGS: The patient's endotracheal tube is seen ending 5-6 cm above the carina. A left IJ line is noted ending about the proximal to mid SVC. Bibasilar chest tubes are noted. No pneumothorax is seen. Bilateral upper lobe and bibasilar airspace opacities remain concerning for multifocal pneumonia, perhaps slightly worsened from the prior study. Small bilateral pleural effusions are suspected. No pneumothorax is seen. The cardiomediastinal silhouette is mildly enlarged. No acute osseous abnormalities are identified. Soft tissue air is noted along the left chest wall. IMPRESSION: 1. Endotracheal tube seen ending 5-6 cm above the carina. 2. Left IJ line noted ending about the proximal to mid SVC. 3. Bilateral upper lobe and bibasilar airspace opacities remain concerning for multifocal pneumonia, perhaps slightly worsened from the prior study. Suspect small bilateral pleural effusions. 4. Mild cardiomegaly. Electronically Signed   By: Roanna Raider M.D.   On: 03/10/2016 18:39   Dg Chest Port 1 View  Result Date: 03/10/2016 CLINICAL DATA:  Bilateral chest tube placement EXAM: PORTABLE CHEST 1 VIEW COMPARISON:  CT chest 03/10/2016 FINDINGS: Bilateral basilar chest tubes. No pneumothorax. Bilateral upper lobe airspace disease. Left lower lobe airspace disease. Stable cardiomediastinal silhouette. No acute  osseous abnormality. Endotracheal tube with the tip 4 cm above the carina. Nasogastric tube coursing below the diaphragm. IMPRESSION: 1. New endotracheal tube with the tip 4 cm above the carina. 2. Bibasilar chest tubes without a pneumothorax. 3. Bilateral upper lobe and left lower lobe airspace disease concerning for multilobar pneumonia. Electronically Signed   By: Elige Ko   On: 03/10/2016 17:20   Dg Chest Port 1 View  Result Date: Mar 23, 2016 CLINICAL DATA:  Acute onset of respiratory failure. Initial encounter. EXAM: PORTABLE CHEST 1 VIEW COMPARISON:  Chest radiograph performed earlier today at 7:12 a.m. FINDINGS: The patient's endotracheal tube is seen ending 4 cm above the carina. An enteric tube is noted extending below the diaphragm. Patchy bilateral airspace opacification is noted, with sparing at the right lung base. This is compatible with multifocal pneumonia, somewhat worsened from the prior study on the left side. Small bilateral pleural effusions are seen. No pneumothorax is seen. The cardiomediastinal silhouette is normal in size. No acute osseous abnormalities are identified. IMPRESSION: 1. Endotracheal tube seen ending 4 cm above the carina. 2. Patchy bilateral airspace opacification, with sparing at the right lung base. This is compatible with multifocal pneumonia, somewhat worsened on the left side from the prior study. Small bilateral pleural effusions seen. Electronically Signed   By: Roanna Raider M.D.   On: 2016/03/23 23:38   Dg Abd Portable 1v  Result Date: 03/16/2016 CLINICAL DATA:  Abdominal tenderness EXAM: PORTABLE ABDOMEN - 1 VIEW COMPARISON:  March 14, 2016 FINDINGS: Nasogastric tube tip and side port in stomach. There is no bowel dilatation or air-fluid level suggesting bowel obstruction. No free air. Chest tube noted in medial left base region. IMPRESSION: Nasogastric tube tip and side port in stomach. Bowel gas pattern unremarkable. No bowel obstruction or free air  evident. Electronically Signed   By: Bretta Bang III M.D.   On: 03/16/2016 07:41   Dg Abd Portable 1v  Result Date: 03/14/2016 CLINICAL DATA:  71 year old male with enteric tube placement. Initial encounter. EXAM: PORTABLE ABDOMEN - 1 VIEW COMPARISON:  Chest CT 03/10/2016. FINDINGS: Portable AP supine views of the lower chest, abdomen and pelvis at 1452 hours. Enteric tube courses to the left upper quadrant, side hole to level of the gastric fundus. Moderate gaseous distension of the stomach. Mild gaseous distention of proximal small bowel. Paucity of bowel gas otherwise. Bilateral chest tubes are now demonstrated. Multifocal patchy left lung opacity. Stable cardiac size and mediastinal contours. No acute osseous abnormality identified. IMPRESSION: 1. Enteric tube courses to the stomach with side hole at the level of the gastric fundus. Moderate gaseous distension of the stomach. 2. Bilateral chest tubes in place.  Multifocal left lung opacity. Electronically Signed   By: Odessa Fleming M.D.   On: 03/14/2016 15:10   Microbiology: No results found for this or any previous visit (from the past 240 hour(s)).   Labs: Basic Metabolic Panel:  Recent Labs Lab 03/19/16 0505  NA 137  K 5.4*  CL 100*  CO2 23  GLUCOSE 134*  BUN 116*  CREATININE 9.21*  CALCIUM 7.9*  MG 2.6*  PHOS 11.6*   CBC:  Recent Labs Lab 03/19/16 0505  WBC 10.2  HGB 8.4*  HCT 25.3*  MCV 82.7  PLT 255   CBG:  Recent Labs Lab 03/19/16 0437 03/19/16 0730 03/19/16 1216 03/19/16 1717 03/19/16 1936  GLUCAP 127* 125* 131* 113* 114*   Urinalysis    Component Value Date/Time   COLORURINE AMBER (A) 02/22/2016 1814   APPEARANCEUR TURBID (A) 03/06/2016 1814   LABSPEC 1.028 03/04/2016 1814   PHURINE 5.0 03/20/2016 1814   GLUCOSEU NEGATIVE 03/20/2016 1814   HGBUR LARGE (A) 03/06/2016 1814   BILIRUBINUR SMALL (A) 03/10/2016 1814   KETONESUR 15 (A) 02/22/2016 1814   PROTEINUR 100 (A) 02/28/2016 1814   NITRITE  NEGATIVE 03/11/2016 1814   LEUKOCYTESUR SMALL (A) 02/27/2016 1814   Sepsis Labs Invalid input(s): PROCALCITONIN,  WBC,  LACTICIDVEN  SIGNED:  Pamella Pert, MD  [facility] 03/25/2016, 1:00 PM  If 7PM-7AM, please contact night-coverage [email or web address] Password [email or web address]

## 2016-04-23 NOTE — Progress Notes (Signed)
Pt resting comfortably

## 2016-04-23 NOTE — Progress Notes (Signed)
Wasted 25 cc of fentanyl drip, witnessed by Yvone NeuBrenda Hall, RN.

## 2016-04-23 NOTE — Progress Notes (Signed)
Patient expired at 1008 pm. Witnessed by Leotis Shames, RN. MD notified. Family called by nurse.

## 2016-04-23 NOTE — Progress Notes (Signed)
Pt appeared slightly restless, gave Ativan.

## 2016-04-23 DEATH — deceased

## 2016-06-01 ENCOUNTER — Ambulatory Visit: Payer: PPO | Admitting: Diagnostic Neuroimaging

## 2018-03-14 IMAGING — CR DG CHEST 1V PORT
1 series · 1 of 1 positions shown · non-contrast
Comparison: Chest radiograph performed earlier today at [DATE] a.m.

CLINICAL DATA: Acute onset of respiratory failure. Initial
encounter.

EXAM:
PORTABLE CHEST 1 VIEW

[AP]
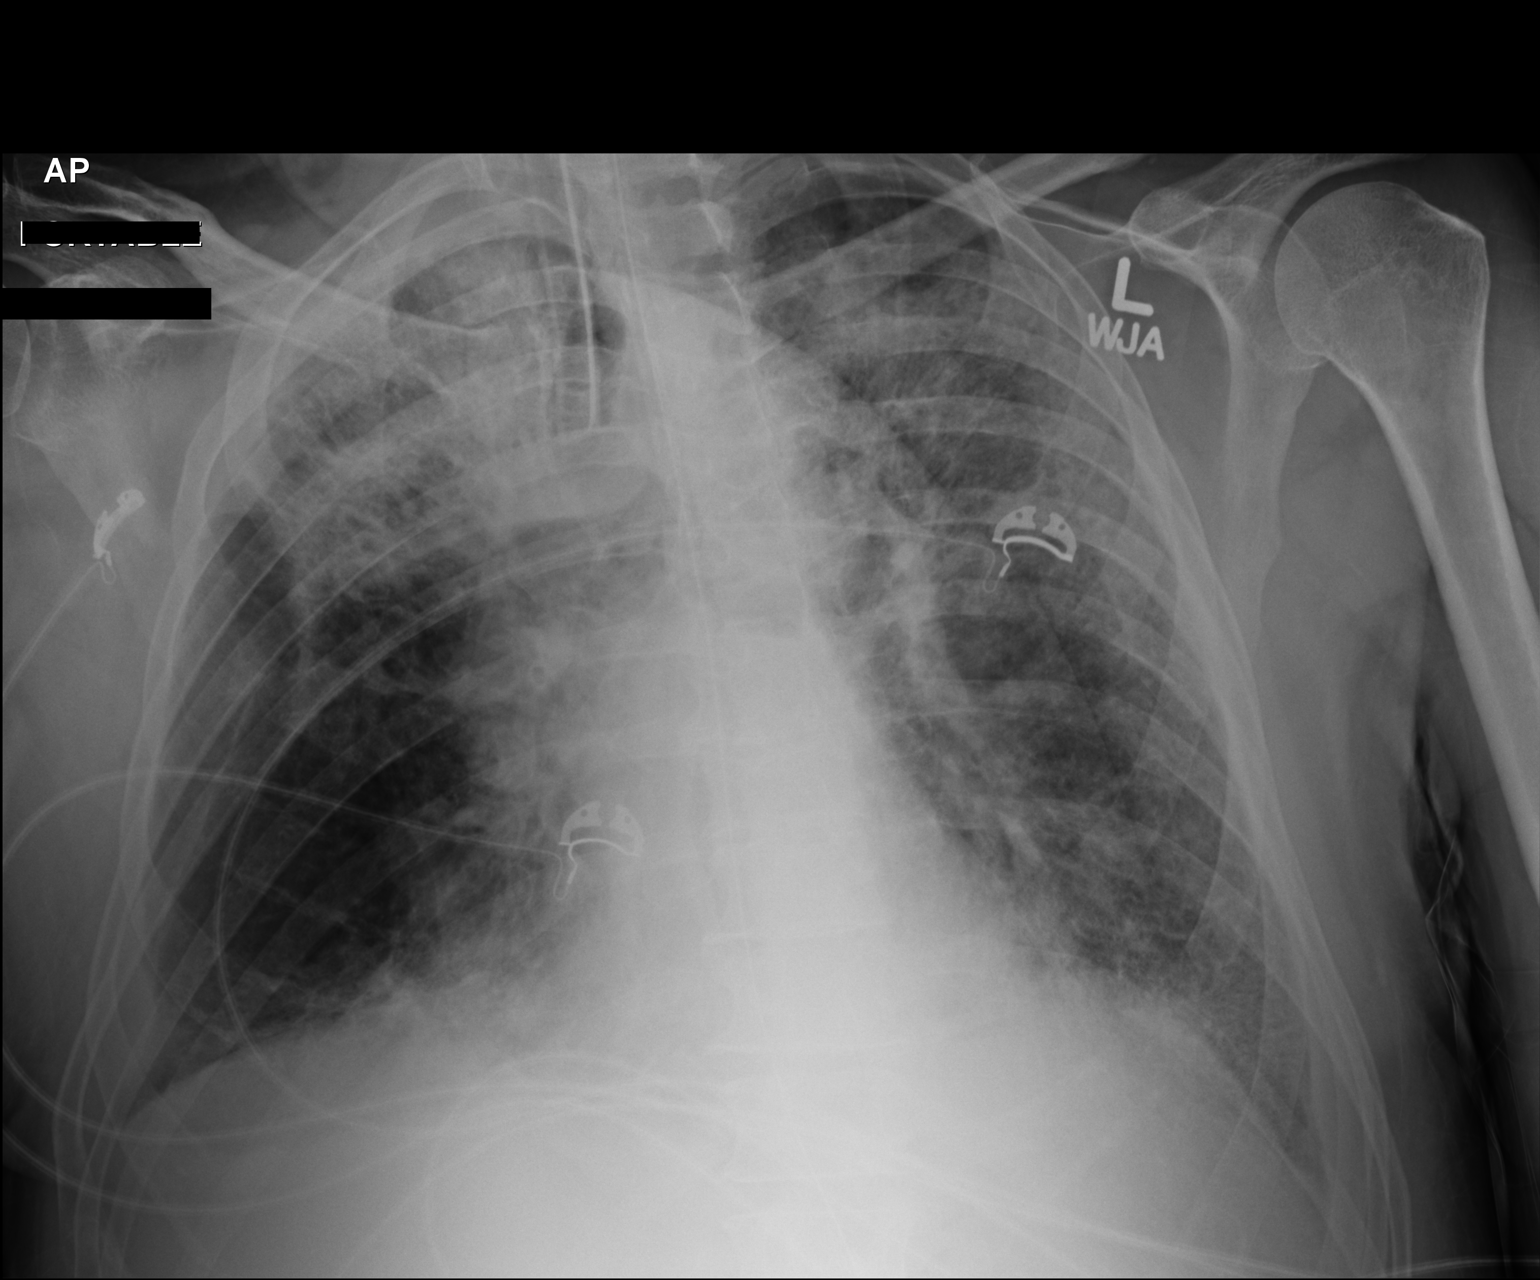

[1 of 1 positions shown; findings below may reference images not displayed]

FINDINGS: The patient's endotracheal tube is seen ending 4 cm above the
carina. An enteric tube is noted extending below the diaphragm.

Patchy bilateral airspace opacification is noted, with sparing at
the right lung base. This is compatible with multifocal pneumonia,
somewhat worsened from the prior study on the left side. Small
bilateral pleural effusions are seen. No pneumothorax is seen.

The cardiomediastinal silhouette is normal in size. No acute osseous
abnormalities are identified.
IMPRESSION: 1. Endotracheal tube seen ending 4 cm above the carina.
2. Patchy bilateral airspace opacification, with sparing at the
right lung base. This is compatible with multifocal pneumonia,
somewhat worsened on the left side from the prior study. Small
bilateral pleural effusions seen.

## 2018-03-15 IMAGING — CT CT CHEST W/O CM
2 of 3 series · 15 of 36 positions shown, 18 images · non-contrast
Comparison: 03/06/2016

CLINICAL DATA: Acute respiratory failure

EXAM:
CT CHEST WITHOUT CONTRAST
TECHNIQUE: Multidetector CT imaging of the chest was performed following the
standard protocol without IV contrast.

[Series 2: chest w/o 2mm st · axial · non-contrast · 0.80mm/px · z∈[+1081,+1349]mm · 12 of 158 slices shown, 15 images]
[im 12/158  mediastinal]
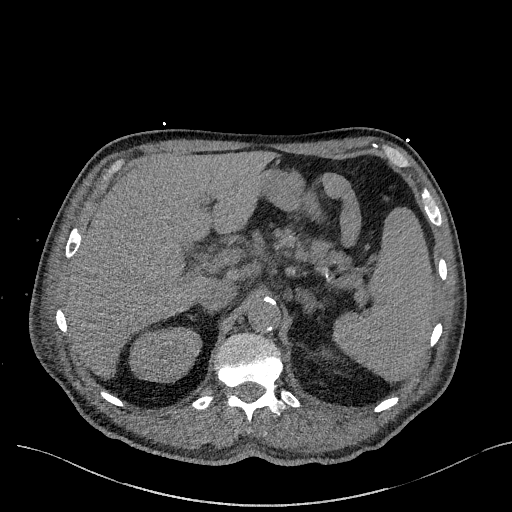
[im 12/158  lung]
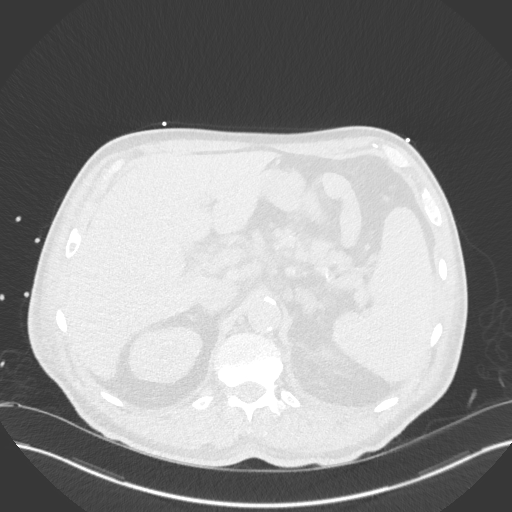
[im 24/158  lung]
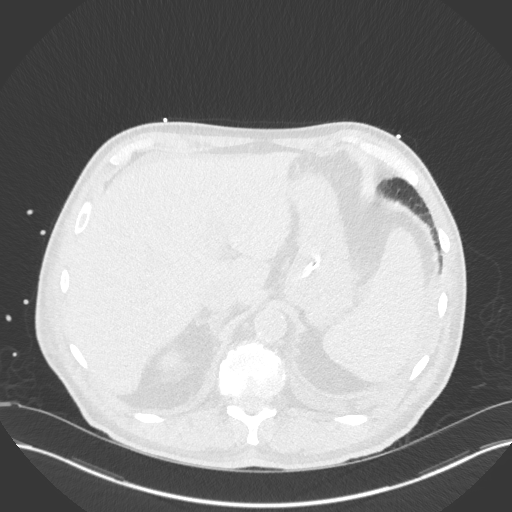
[im 35/158  lung]
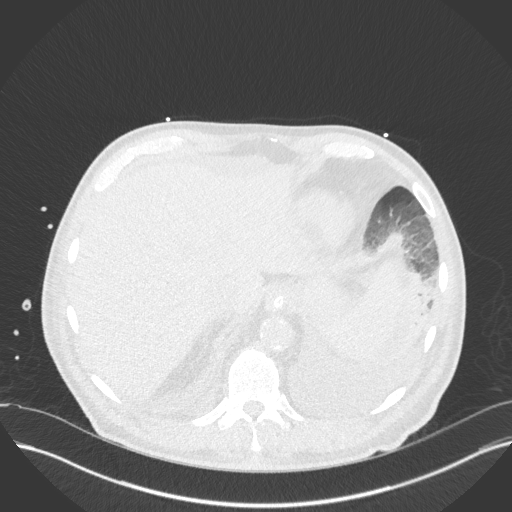
[im 47/158  lung]
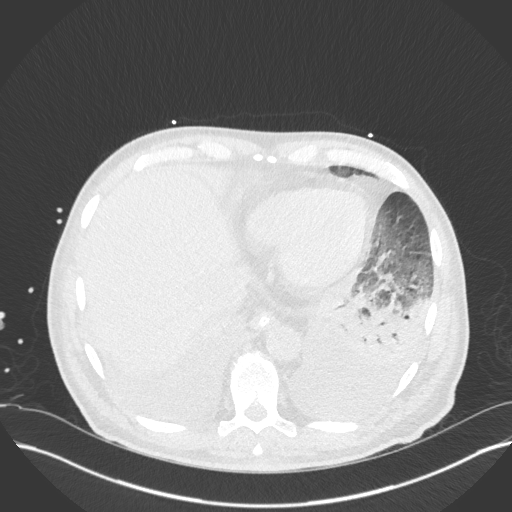
[im 59/158  mediastinal]
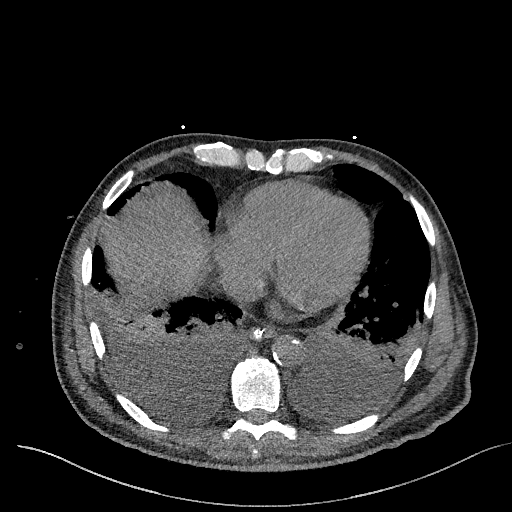
[im 59/158  lung]
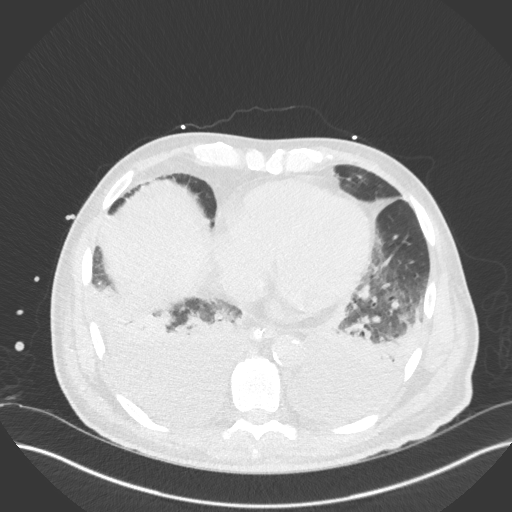
[im 70/158  lung]
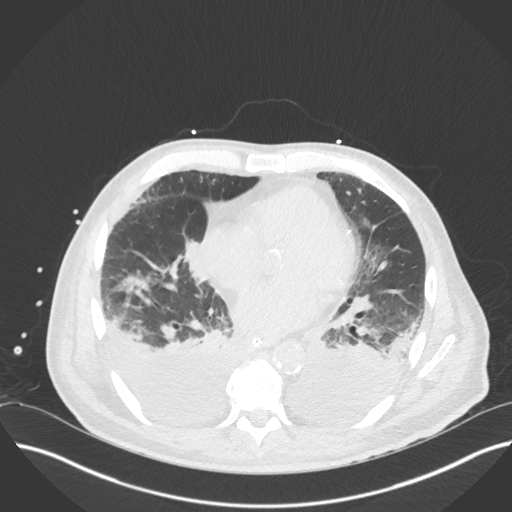
[im 88/158  lung]
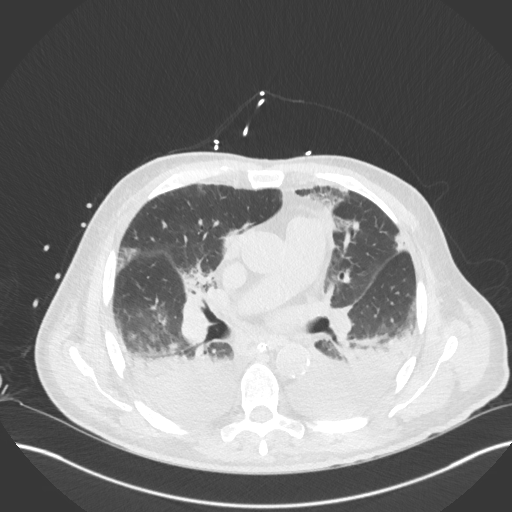
[im 99/158  lung]
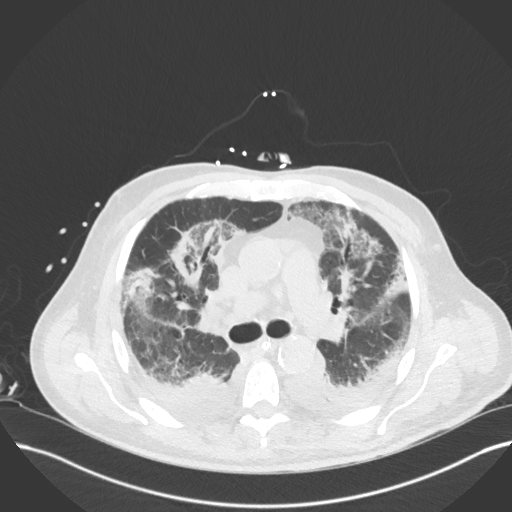
[im 111/158  mediastinal]
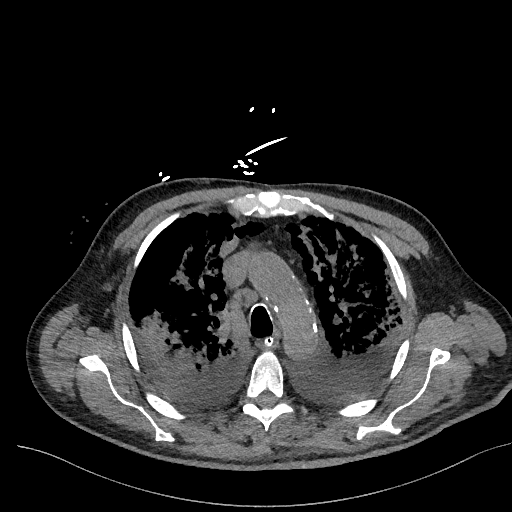
[im 111/158  lung]
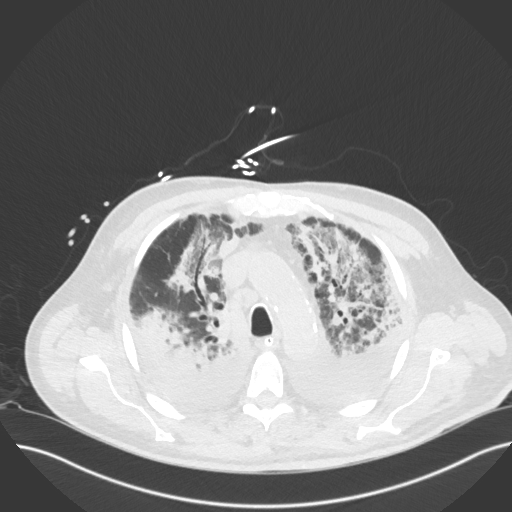
[im 123/158  lung]
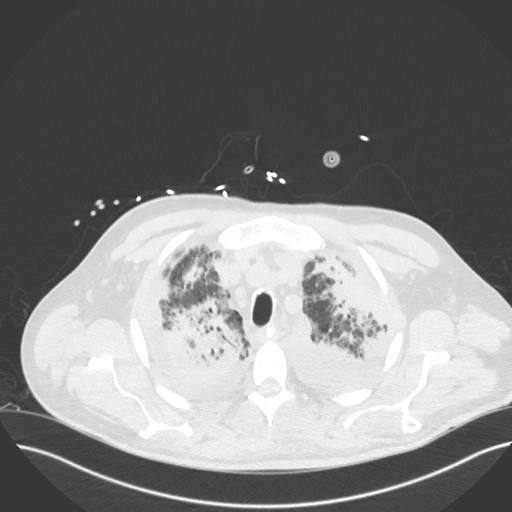
[im 134/158  lung]
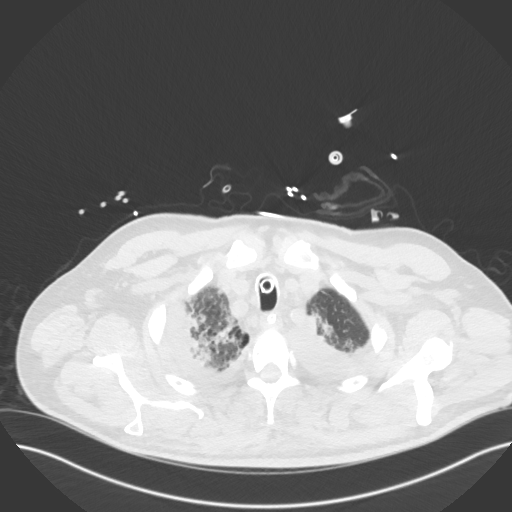
[im 146/158  lung]
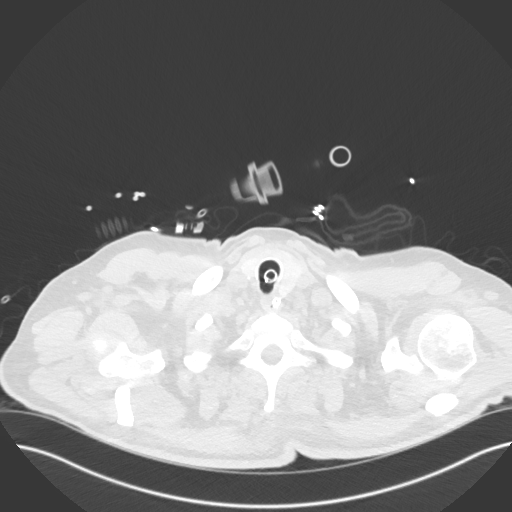

[Series 4: chest w/o 3mm st cor · coronal · non-contrast · 0.59mm/px · 3 of 97 slices shown]
[im 20/97  lung]
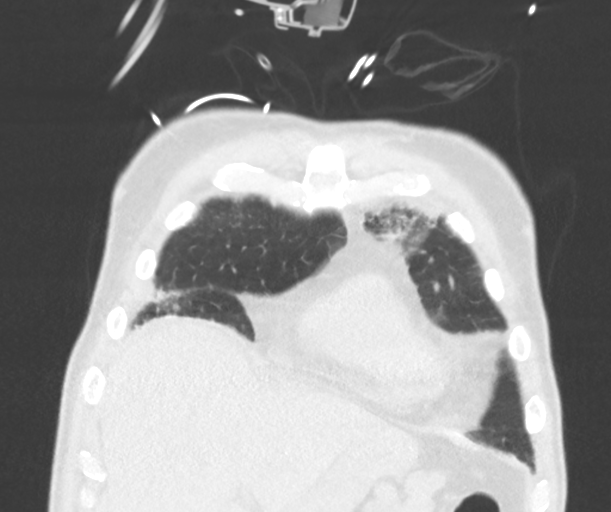
[im 39/97  lung]
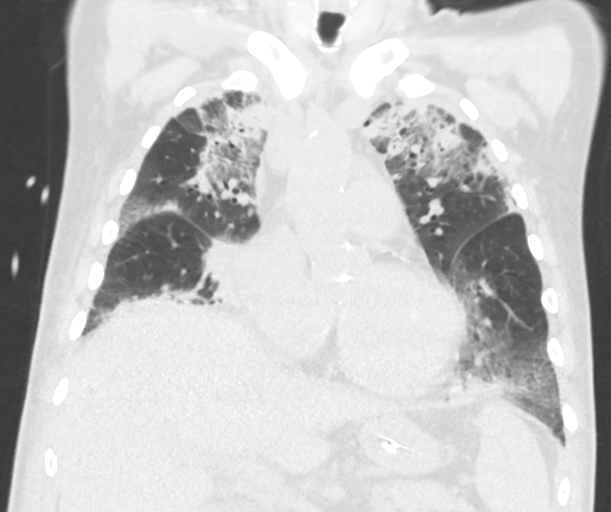
[im 58/97  lung]
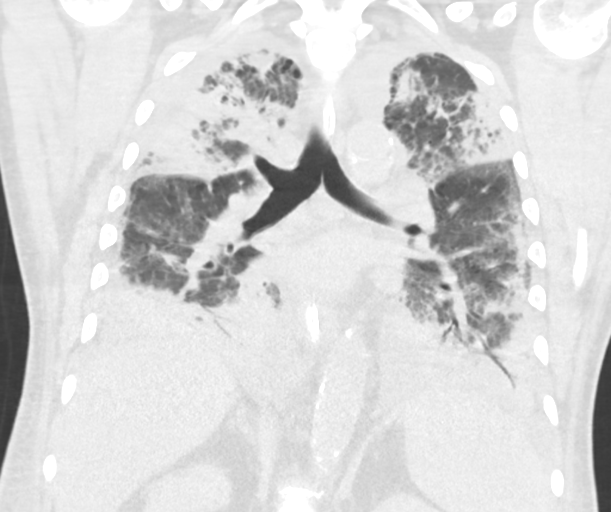

[15 of 36 positions shown; findings below may reference images not displayed]

FINDINGS: Cardiovascular: Atherosclerotic calcifications in the aorta and
great vessels are stable. Coronary artery calcifications are present
involving all 3 coronary arteries. No evidence of ascending aortic
aneurysm or intramural hematoma. Prominent aortic valvular
calcifications are present.

Mediastinum/Nodes: Abnormal mediastinal adenopathy is unchanged.
cm paratracheal node on image 42 is stable. Other prominent
mediastinal nodes are present. No evidence of pericardial effusion.
No evidence of pneumomediastinum. Tracheostomy tube is stable. NG
tube tip is in the fundus of the stomach. Thyroid is unremarkable.
There is stranding within the fat of the mediastinum. This does
extend around the subclavian and axillary vessels.

Lungs/Pleura: Heterogeneous opacities throughout both lungs are not
significantly changed. Is characterized by areas of interstitial
lung disease, ground-glass, and confluent consolidation with
dependent air bronchograms. No obvious endobronchial airway lesion
can be identified. Airways are grossly patent. Moderate bilateral
pleural effusions have increased in size.

Upper Abdomen: The gallbladder is distended. There is abnormal
adenopathy in the gastrohepatic ligament and periportal regions. A
small amount of free fluid is present anterior to the liver.

Musculoskeletal: No vertebral compression deformity.
IMPRESSION: Extensive bilateral pulmonary opacities worrisome for an
inflammatory process, ARDS, or edema are stable. Malignancy is not
excluded.

Moderate bilateral pleural effusions have increased.

Small amount of free fluid is seen anterior to the liver.

Mediastinal and upper abdominal adenopathy is nonspecific.
